# Patient Record
Sex: Male | Born: 1935
Health system: Southern US, Community
[De-identification: ages and names within clinical notes are randomized; demographics above are authoritative.]

## PROBLEM LIST (undated history)

## (undated) DIAGNOSIS — I10 Essential (primary) hypertension: Secondary | ICD-10-CM

## (undated) DIAGNOSIS — D66 Hereditary factor VIII deficiency: Secondary | ICD-10-CM

## (undated) HISTORY — PX: OTHER SURGICAL HISTORY: SHX169

---

## 1999-10-19 ENCOUNTER — Inpatient Hospital Stay (HOSPITAL_COMMUNITY): Admission: EM | Admit: 1999-10-19 | Discharge: 1999-10-21 | Payer: Self-pay | Admitting: Emergency Medicine

## 1999-10-19 ENCOUNTER — Encounter: Payer: Self-pay | Admitting: Internal Medicine

## 1999-12-11 ENCOUNTER — Encounter: Payer: Self-pay | Admitting: Emergency Medicine

## 1999-12-12 ENCOUNTER — Observation Stay (HOSPITAL_COMMUNITY): Admission: EM | Admit: 1999-12-12 | Discharge: 1999-12-13 | Payer: Self-pay | Admitting: Emergency Medicine

## 1999-12-12 ENCOUNTER — Encounter: Payer: Self-pay | Admitting: Emergency Medicine

## 2001-04-14 ENCOUNTER — Emergency Department (HOSPITAL_COMMUNITY): Admission: EM | Admit: 2001-04-14 | Discharge: 2001-04-15 | Payer: Self-pay | Admitting: Emergency Medicine

## 2001-04-14 ENCOUNTER — Encounter: Payer: Self-pay | Admitting: Emergency Medicine

## 2001-04-15 ENCOUNTER — Inpatient Hospital Stay (HOSPITAL_COMMUNITY): Admission: EM | Admit: 2001-04-15 | Discharge: 2001-04-21 | Payer: Self-pay | Admitting: Oncology

## 2001-04-15 ENCOUNTER — Encounter: Payer: Self-pay | Admitting: Oncology

## 2001-05-16 ENCOUNTER — Encounter (HOSPITAL_COMMUNITY): Admission: RE | Admit: 2001-05-16 | Discharge: 2001-08-14 | Payer: Self-pay | Admitting: Oncology

## 2001-07-13 ENCOUNTER — Encounter: Payer: Self-pay | Admitting: Emergency Medicine

## 2001-07-13 ENCOUNTER — Emergency Department (HOSPITAL_COMMUNITY): Admission: EM | Admit: 2001-07-13 | Discharge: 2001-07-13 | Payer: Self-pay | Admitting: Emergency Medicine

## 2001-09-12 ENCOUNTER — Encounter: Payer: Self-pay | Admitting: Oncology

## 2001-09-12 ENCOUNTER — Inpatient Hospital Stay (HOSPITAL_COMMUNITY): Admission: EM | Admit: 2001-09-12 | Discharge: 2001-09-16 | Payer: Self-pay | Admitting: Oncology

## 2001-09-26 ENCOUNTER — Encounter (HOSPITAL_COMMUNITY): Admission: RE | Admit: 2001-09-26 | Discharge: 2001-09-28 | Payer: Self-pay | Admitting: Oncology

## 2002-01-11 ENCOUNTER — Encounter (HOSPITAL_COMMUNITY): Admission: RE | Admit: 2002-01-11 | Discharge: 2002-04-11 | Payer: Self-pay | Admitting: Oncology

## 2002-11-17 ENCOUNTER — Encounter: Payer: Self-pay | Admitting: Emergency Medicine

## 2002-11-18 ENCOUNTER — Inpatient Hospital Stay (HOSPITAL_COMMUNITY): Admission: EM | Admit: 2002-11-18 | Discharge: 2002-11-22 | Payer: Self-pay | Admitting: Emergency Medicine

## 2002-11-18 ENCOUNTER — Encounter: Payer: Self-pay | Admitting: Emergency Medicine

## 2002-11-18 ENCOUNTER — Encounter: Payer: Self-pay | Admitting: Internal Medicine

## 2002-12-26 ENCOUNTER — Encounter: Payer: Self-pay | Admitting: Orthopedic Surgery

## 2002-12-26 ENCOUNTER — Inpatient Hospital Stay (HOSPITAL_COMMUNITY): Admission: RE | Admit: 2002-12-26 | Discharge: 2003-01-04 | Payer: Self-pay | Admitting: Orthopedic Surgery

## 2003-01-01 ENCOUNTER — Encounter: Payer: Self-pay | Admitting: Oncology

## 2003-01-21 ENCOUNTER — Emergency Department (HOSPITAL_COMMUNITY): Admission: EM | Admit: 2003-01-21 | Discharge: 2003-01-21 | Payer: Self-pay | Admitting: Emergency Medicine

## 2003-01-26 ENCOUNTER — Encounter (HOSPITAL_COMMUNITY): Admission: RE | Admit: 2003-01-26 | Discharge: 2003-01-26 | Payer: Self-pay | Admitting: Oncology

## 2003-01-26 ENCOUNTER — Emergency Department (HOSPITAL_COMMUNITY): Admission: EM | Admit: 2003-01-26 | Discharge: 2003-01-26 | Payer: Self-pay | Admitting: Emergency Medicine

## 2003-04-02 ENCOUNTER — Encounter (HOSPITAL_COMMUNITY): Admission: RE | Admit: 2003-04-02 | Discharge: 2003-04-11 | Payer: Self-pay | Admitting: Oncology

## 2003-04-02 ENCOUNTER — Encounter: Payer: Self-pay | Admitting: Oncology

## 2003-04-13 ENCOUNTER — Inpatient Hospital Stay (HOSPITAL_COMMUNITY): Admission: EM | Admit: 2003-04-13 | Discharge: 2003-04-25 | Payer: Self-pay | Admitting: Oncology

## 2003-04-14 ENCOUNTER — Encounter (HOSPITAL_COMMUNITY): Payer: Self-pay | Admitting: Oncology

## 2003-04-18 ENCOUNTER — Encounter (INDEPENDENT_AMBULATORY_CARE_PROVIDER_SITE_OTHER): Payer: Self-pay | Admitting: Cardiology

## 2003-04-19 ENCOUNTER — Encounter: Payer: Self-pay | Admitting: Oncology

## 2003-11-10 ENCOUNTER — Emergency Department (HOSPITAL_COMMUNITY): Admission: EM | Admit: 2003-11-10 | Discharge: 2003-11-10 | Payer: Self-pay | Admitting: Emergency Medicine

## 2003-11-11 ENCOUNTER — Inpatient Hospital Stay (HOSPITAL_COMMUNITY): Admission: EM | Admit: 2003-11-11 | Discharge: 2003-11-16 | Payer: Self-pay | Admitting: Emergency Medicine

## 2003-12-09 ENCOUNTER — Inpatient Hospital Stay (HOSPITAL_COMMUNITY): Admission: EM | Admit: 2003-12-09 | Discharge: 2003-12-18 | Payer: Self-pay

## 2004-06-23 ENCOUNTER — Inpatient Hospital Stay (HOSPITAL_COMMUNITY): Admission: EM | Admit: 2004-06-23 | Discharge: 2004-06-28 | Payer: Self-pay | Admitting: Emergency Medicine

## 2004-07-09 ENCOUNTER — Encounter: Admission: RE | Admit: 2004-07-09 | Discharge: 2004-08-06 | Payer: Self-pay | Admitting: Internal Medicine

## 2004-12-15 ENCOUNTER — Ambulatory Visit (HOSPITAL_COMMUNITY): Admission: RE | Admit: 2004-12-15 | Discharge: 2004-12-15 | Payer: Self-pay | Admitting: Oncology

## 2005-01-29 ENCOUNTER — Ambulatory Visit: Payer: Self-pay | Admitting: Oncology

## 2005-10-23 ENCOUNTER — Ambulatory Visit: Payer: Self-pay | Admitting: Oncology

## 2006-02-01 ENCOUNTER — Ambulatory Visit: Payer: Self-pay | Admitting: Oncology

## 2006-04-08 ENCOUNTER — Ambulatory Visit: Payer: Self-pay | Admitting: Oncology

## 2006-08-03 ENCOUNTER — Ambulatory Visit: Payer: Self-pay | Admitting: Oncology

## 2009-01-03 ENCOUNTER — Ambulatory Visit: Payer: Self-pay | Admitting: Oncology

## 2010-01-06 ENCOUNTER — Ambulatory Visit: Payer: Self-pay | Admitting: Oncology

## 2011-01-08 ENCOUNTER — Encounter (HOSPITAL_BASED_OUTPATIENT_CLINIC_OR_DEPARTMENT_OTHER): Payer: Medicare Other | Admitting: Oncology

## 2011-01-08 DIAGNOSIS — M2500 Hemarthrosis, unspecified joint: Secondary | ICD-10-CM

## 2011-01-08 DIAGNOSIS — D66 Hereditary factor VIII deficiency: Secondary | ICD-10-CM

## 2011-01-08 DIAGNOSIS — G894 Chronic pain syndrome: Secondary | ICD-10-CM

## 2011-01-16 NOTE — Op Note (Signed)
NAME:  [name], [name]                       ACCOUNT NO.:  [id number]   MEDICAL RECORD NO.:  [id number]                   PATIENT TYPE:  INP   LOCATION:  0356                                 FACILITY:  [facility]   PHYSICIAN:  Excell Seltzer. Annabell Howells, M.D.                 DATE OF BIRTH:  11/17/1930   DATE OF PROCEDURE:  11/13/2003  DATE OF DISCHARGE:                                 OPERATIVE REPORT   PROCEDURE:  1. Cystoscopy.  2. Bilateral retrograde pyelograms with interpretation.   PREOPERATIVE DIAGNOSIS:  Hematuria with a history of right hydronephrosis.   POSTOPERATIVE DIAGNOSIS:  Hematuria with a history of right hydronephrosis.   SURGEON:  Dr. Bjorn Pippin.   ANESTHESIA:  General.   COMPLICATIONS:  None.   INDICATIONS:  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  CT scan revealed  right hydronephrosis with a questionable filling defect in the collecting  system with dilation of the ureter to the UVJ.  It was felt that cystoscopy,  retrogrades, and possibly ureteroscopy was indicated.   FINDINGS AND PROCEDURE:  The patient was given IV Cipro.  He was taken to  the operating room where a general anesthetic was induced.  He was placed in  lithotomy position.  This was somewhat restricted by his orthopedic issues,  but he was able to be adequately positioned for the study.  His genitalia  was prepped with Betadine solution.  He was draped in the usual sterile  fashion.  A 22 French cystoscope sheath was placed through his slightly  hypospadiac meatus without need for dilation.  Urethra was unremarkable.  The external sphincter was intact.  The prostatic urethra was short without  significant obstruction.  Examination of the bladder revealed moderate to  severe trabeculation without tumor, stones, or inflammation.  The ureteral  orifices were in their normal anatomic position, effluxing clear urine.  The  left ureteral orifice was cannulated with the aid of a guidewire, and  contrast was instilled.  There was some air in the system because of the  need to use the guidewire to get the 5 [place] open-end catheter up, but the  study revealed a normal ureter and internal collecting system.  The right  ureter was cannulated with 5 French open-end catheter.  Contrast was  instilled.  No hydronephrosis or filling defects were identified, and this  study was completely unremarkable.  Of note, on fluoroscopic views, the  patient did have significant angulation of the spine at about L3-L4 level,  possibly due to a compression fracture.  I do not know if  this is chronic or acute finding, but there were no urologic abnormality of  concern.  At this point, the bladder was drained; the patient was taken down  from lithotomy position.  His anesthetic was reversed.  He was moved to the  recovery  room in stable condition.  There were no complications.                                               Excell Seltzer. Annabell Howells, M.D.    JJW/MEDQ  D:  11/13/2003  T:  11/13/2003  Job:  [id number]   cc:   Valentino Hue. Magrinat, M.D.  [address] [facility]  [place]  [place] [address]  Fax: [phone]   Rosanne Sack, M.D.  [address]  [place], [place] [address]  Fax: [phone]

## 2011-01-16 NOTE — H&P (Signed)
NAMEJACKSEN, [name] NO.:  [id number]   MEDICAL RECORD NO.:  [id number]          PATIENT TYPE:  EMS   LOCATION:  ED                           FACILITY:  [facility]   PHYSICIAN:  Hollice Espy, M.D.DATE OF BIRTH:  16-Dec-1930   DATE OF ADMISSION:  06/23/2004  DATE OF DISCHARGE:                                HISTORY & PHYSICAL   CHIEF COMPLAINT:  Hyperglycemia and abdominal pain.   HISTORY OF PRESENT ILLNESS:  Patient is a 77 year old white male past  medical history of Hemophilia A as well as chronic back pain requiring  chronic narcotics.  Patient is normally followed by Dr. Darnelle Catalan for his  hemophilia.  He also manages his pain medications.  Patient is on a number  of narcotics although he ran out a few weeks ago and has since then had to  decrease his narcotic medication so that he can space it out.  He continued  to have more and more severe pain and for the last day has been feeling  worse and worse and then sort of having severe abdominal pain with nausea  and vomiting starting today.  Family became concerned and brought him in to  see Dr. Azucena Cecil, a family practice doctor who had seen the patient a while  back but had not seen him in the recent year or so.  In doing lab workup  patient was noted to have an elevated blood sugar in the 500s which was a  new diagnosis.  In addition he had an elevated white count.  Dr. Azucena Cecil was  concerned that the patient may be having withdrawal from his narcotic  medication and also in addition may be is having newly diagnosed diabetes or  at least need of further evaluation.  He was advised to come into the  emergency room where he was given pain medication, IV fluids, and further  workup was begun.  Patient's lab work he was noted to have a slightly  elevated white count at 11.5, in addition his glucose level was checked on a  serum BMET at 523, patient's lipase level was normal and his UA showed  greater than 1000 of  glucose with a trace hemoglobin, bilirubin, and total  protein.  Patient was given pain medication.  He currently states he is  feeling a little bit better but he is quite sleepy.  He was complaining of  some abdominal pain but is quite vague due to his drowsiness.  He denies any  headaches or visual changes.  He denies any current chest pain,  palpitations, shortness of breath, wheeze, or cough.  He complains of some  abdominal pain which initially he described as diffuse with no focal  tenderness but then on examination and further interrogation he states that  it is more severe in the lower quadrants right and left.  He denies any  current back pain.  He also does not complain of any severe diarrhea or  hematuria.  He, according to his family, has no previous history of steroid  use or history of previously diagnosed diabetes.   PAST MEDICAL  HISTORY:  1.  Arthritis.  2.  Hypertension.  3.  He has a history of Hemophilia A with clotting factor VIII.   MEDICATIONS:  1.  MS Contin 60 mg t.i.d.  2.  MSIR 15 mg p.r.n.  3.  Atenolol 50 mg p.o. b.i.d.  4.  Clonidine 0.1 mg b.i.d.  5.  Norvasc 5 mg p.o. daily.  6.  Milk of magnesia laxative every other day.   ALLERGIES:  Patient is allergic to CODEINE as well as there is a report here  on his medical information sheet that because of his hemophilia he is not to  receive IV MORPHINE.   HABITS:  Patient denies any tobacco, alcohol, or drug use.   FAMILY HISTORY:  Noncontributory.   PHYSICAL EXAMINATION:  VITALS:  The patient's vitals on admission  temperature 97.2, blood pressure is 186/83, since then it has come down to  172/77, respirations 20, heart rate is initially 72, O2 saturation 98% on  room air.  GENERAL:  He appears to be quite drowsy but when awakened he can remain  awake enough to be oriented x3.  HEENT:  Normocephalic, atraumatic.  His mucous membranes are dry.  He has no  carotid bruits.  HEART:  Regular rate and  rhythm, S1, S2.  LUNGS:  Clear to auscultation bilaterally.  ABDOMEN:  Soft, slightly distended, it is tender mostly in the right and  left lower quadrants with deep palpation.  EXTREMITIES:  No clubbing, cyanosis, or edema.  He has moderately 1+ pulses  bilaterally.   LABORATORY WORK:  White count of 11.5 with a 94% shift, H&H 14.4 and 45.1,  MCV of 93, platelet count of 165.  Sodium 132, potassium 4.6, chloride 99,  bicarb 19, BUN 12, creatinine 1.3, glucose 523.  LFTs:  Total protein of  8.2, albumin 4.2, AST is elevated at 207, ALT is 188, alk phos is 103, total  bili is 2.6, lipase is 21, PT and INR is 13.3 and 1.  UA shows greater than  1000 glucose, trace hemoglobin, 40 of ketone, total protein of 100 A1C is  drawn and pending, white cells in the urine is 0-2.  Patient is having a CT  of the abdomen and pelvis ordered, however, he has not been able to keep  down his contrast.   ASSESSMENT AND PLAN:  1.  Hyperglycemia.  Patient has a CBG in the high 500s.  No previous history      of diabetes mellitus or being on steroids, maybe an etiology of his      abdominal pain, patient received insulin in the emergency room, follow      q.6h. CBGs and insulin coverage, check an A1C.  Other etiologies given      his slightly elevated transaminases and abdominal pain this may be some      sort of obstruction of both his pancreas and his liver.  2.  Abdominal pain with nausea and vomiting.  He has some increased      tenderness in the lower quadrants though this may be a withdrawal from      narcotics versus diverticulitis versus constipation versus as above      maybe some sort of form of pancreatitis.  We will go ahead and check a      CT with contrast.  If need be we will need to put an nasogastric tube      down.  In addition we will check a lipase level now.  3.  History of Hemophilia A - has received Factor VIII in the past and     chemo.  He is stabled, is followed by Dr. Darnelle Catalan,  this appears to be a      stable issue for now, we will not consult unless need be.  4.  History of chronic back pain and arthritis.  He is on intravenous      Dilaudid.  He is not to receive intravenous      morphine but once he is able to take p.o. we will restart his MS Contin.  5.  Hypertension.  All his p.o. medications are held until he is cleared by      CT.  Intravenous as needed hypertensive medications.     Send   SKK/MEDQ  D:  06/23/2004  T:  06/23/2004  Job:  [id number]   cc:   Valentino Hue. Magrinat, M.D.  [address] [facility]  [place]  [place] [address]  Fax: [phone]   Tally Joe, M.D.  [address] [place] [address]  [place], [place] [address]  Fax: [phone]

## 2011-01-16 NOTE — Discharge Summary (Signed)
[name], [name] NO.:  [id number]   MEDICAL RECORD NO.:  [id number]          PATIENT TYPE:  INP   LOCATION:  0346                         FACILITY:  [facility]   PHYSICIAN:  Deirdre Peer. Polite, M.D. DATE OF BIRTH:  07-02-1931   DATE OF ADMISSION:  06/23/2004  DATE OF DISCHARGE:  06/28/2004                                 DISCHARGE SUMMARY   DISCHARGE DIAGNOSES:  1.  Newly diagnosed diabetes with hemoglobin A1c of 10.7.  2.  Constipation secondary to narcotics plus or minus dehydration, plus or      minus a component of gastroparesis secondary to diabetes.  3.  Hemophilia A.  4.  Hypertension.  5.  Arthritis.  6.  Chronic pain.  7.  Dysuria, we will treat empirically for urinary tract infection, we will      obtain culture prior to discharge as symptoms were first mentioned day      of discharge.  8.  Constipation.  9.  Rule out gastroparesis.  10. Hepatitis C.   DISCHARGE MEDICATIONS:  1.  MS Contin 60 mg 1 every 8 hours.  2.  Protonix 40 mg daily.  3.  Lantus 26 units daily.  4.  Glyburide 5 mg every 12 hours.  5.  Atenolol 50 mg 1 every 12 hours.  6.  Cipro 250 mg every 12 hours x3 days.  7.  MiraLax 17 grams in 8 ounces of water daily.  8.  Reglan 10 mg every 6 hours.  9.  MSIR every 3-4 hours as needed p.r.n.  10. Clonidine home dose.   DISPOSITION:  The patient is being discharged to home in stable condition,  asked to follow up with primary MD in 3-4 days.  The patient is asked to  check blood sugars twice a day, keep a log and take the numbers to his  primary MD for further titration of diabetes medications.   The patient is asked to take all medications as instructed, not to try to  withdraw himself from his narcotic medications.   CONSULTANTS:  None.   STUDIES:  UA:  Specific gravity 1.034, greater than 1000 glucose, LE and  nitrite negative, no bacteria, hemoglobin A1c 10.7.  BMET within normal  limits except for elevated CBG.  AST and  ALT 207 and 108, followup labs 153  and 160.  Hepatitis C antibody positive, please note this is an old  diagnosis, hepatitis C.  CT of the abdomen and pelvis significant for  diverticulosis, no itis, right inguinal hernia, no bowel obstruction.  Please note there is a small right inguinal hernia containing only fat, no  evidence of herniated bowel or bowel obstruction.  Mild sigmoid  diverticulosis, no evidence of diverticulitis.  Chest x-ray:  No change in  elevation of right hemidiaphragm, no active lung disease.  Urine culture:  35,000 colonies, multiple species.  Abdominal series unremarkable good bowel  gas pattern does show stool within the colon.   HISTORY OF PRESENT ILLNESS:  A 77 year old male with multiple medical  problems who presents to the hospital for evaluation after presenting to his  primary  M.D. office for evaluation.  In the primary MD's office the patient  was found to have hyperglycemia.  The patient did not have a diagnosis of  diabetes at that time, therefore, the patient was sent to the hospital for  further evaluation and treatment as he had also complaints of abdominal  pain.  Admission was deemed necessary for further evaluation and treatment.  Please see dictated H&P for further details.   PAST MEDICAL HISTORY:  As stated above.   ADMISSION MEDICATIONS:  Per admission H&P.   SOCIAL HISTORY:  No tobacco, no alcohol, no drugs.   HOSPITAL COURSE:  1.  The patient was admitted to medicine floor bed for evaluation and      treatment of newly diagnosed diabetes.  The patient had screening labs      which included a hemoglobin A1c which revealed A1c of 10.7.  Because of      the patients complaints of abdominal pain and constipation +/-      gastroparesis, p.o. intake was limited, therefore in the interim the      patient was contained on sliding-scale insulin and Lantus.  The      patient's medications were slowly increased adding in oral agent, as      well  as increasing the Lantus.  The patient was seen by a diabetic      educator and was given education on diabetes, the patient has been made      aware of an ADA diet.  The patient has been informed that his      medications will most likely need to be increased and this will be done      by his primary MD as his diet necessitates.  The patient was given      prescription for all meds and for Glucometer.  The patient was      instructed to check blood sugars twice a day and keep a log, and provide      this information to his primary MD for further titration of his      medicine.  The patient will be seen by home health RN for further      diabetic educations.  2.  Abdominal pain.  Differential diagnosis for the patients abdominal pain      is multifactorial.  The patient had a CT and abdominal series, there is      no pathology noted on the CT or the abdominal series, the patient did      have increased stool in the colon.  As the patient was newly diagnosed      with diabetes, it was also suspected the patient may have developed      gastroparesis +/- dehydration as a result of his uncontrolled diabetes.      The patient was continued on IV fluids, adequate treatment for his      diabetes, and IV Reglan was added as well as laxative.  After having      several bowel movements the patient had significant improvement in his      complaints of abdominal discomfort.  At this time it was felt that no      further evaluation is needed, still felt that it is multifactorial for      his complaints of abdominal discomfort.  As stated, CT was negative for      pathology.  The patient has been somewhat irregular in his laxative use,      discussed this  with the patient, recommended MiraLax daily.  Because of      diabetes I feel that some amount of gastroparesis may be an issue,      therefore, Reglan has been added.  It is recommended that the patient     have further outpatient followup with his  primary MD and determine if      indeed Reglan is needed as part of the patients medical regimen.  In the      interim, definitely recommend the patient continue daily laxatives as he      is on chronic narcotics.  3.  Dysuria.  On the day of discharge the patient complained of some      dysuria.  The patient is afebrile.  The patient will be discharged to      home with Cipro 250 mg b.i.d. x3 days, we will try to obtain a      urinalysis prior to discharge.  The patient can have further followup      for this with his primary M.D. on an outpatient basis.  The patient has      several other chronic medical problems which include hemophilia A and      hypertension, and had not had any problems during this hospitalization.      The patient will continue with his current outpatient meds.  The patient      also has chronic pain.  The patients medical regimen has been reviewed      with him as well.  It was recommended that the patient continue meds as      outlined.  Seeing that the patient has some concerns about his      medications and has tried to wean himself off of his chronic narcotics,      the patient has been counseled against this at this time and is      instructed to take all medications as outlined by M.D. and only should      have titration of his narcotics by a physician.  At this time the      patient is medically stable for discharge.     Joen Laura   RDP/MEDQ  D:  06/28/2004  T:  06/28/2004  Job:  [id number]   cc:   Tally Joe, M.D.  [address] [place] [address]  [place], [place] [address]  Fax: [phone]   Valentino Hue. Magrinat, M.D.  [address] [facility]  [place]  [place] [address]  Fax: [phone]

## 2011-01-16 NOTE — Consult Note (Signed)
NAME:  ANUJ, SUMMONS                       ACCOUNT NO.:  [id number]   MEDICAL RECORD NO.:  [id number]                   PATIENT TYPE:  INP   LOCATION:  0270                                 FACILITY[facility]  [facility]   PHYSICIAN:  Excell Seltzer. Annabell Howells, M.D.                 DATE OF BIRTH:  07-02-1931   DATE OF CONSULTATION:  12/11/2003  DATE OF DISCHARGE:                                   CONSULTATION   REFERRING PHYSICIAN:  Leighton Roach. Truett Perna, M.D.   CHIEF COMPLAINT:  Left groin pain.   HISTORY:  This is a 77 year old white male with hemophilia A admitted for  right chest wall hematoma.  He is complaining of some discomfort in the left  groin area.  I did a thorough GU workup of this individual in March, 2005  for hematuria and right hydronephrosis at the time of his last bleed.  That  workup was negative.  He does complain of some dysuria, and on admission UA,  appears to have a urinary tract infection.  I find no culture results.  The  patient has been placed on Cipro.  He had a CT scan of the chest which  revealed no abnormalities of the upper abdomen.   PAST MEDICAL HISTORY:  Significant for hemophilia A, recurrent hemarthrosis,  chronic pain syndrome, hypertension, history of coronary artery disease,  chronic renal insufficiency, prior knee replacements, hepatitis C positive,  and chronic constipation.   ALLERGIES:  1. NONSTEROIDALS ANTI-INFLAMMATORIES.  2. ASPIRIN.   CURRENT MEDICATIONS:  Norvasc, Protonix, morphine, Tenormin, Catapres,  Cardura, Tylenol, Benadryl, MiraLax, lisinopril, Cipro, factor VIII.   FAMILY/SOCIAL HISTORY:  Unchanged since my last consultation on November 13, 2003.   REVIEW OF SYSTEMS:  He complains of some chest wall pain and ecchymosis.  He  has had a fever felt to be likely secondary to his hematoma.  He has the  dysuria, as noted above, and the left groin pain.   PHYSICAL EXAMINATION:  VITAL SIGNS:  Temp 99.2, heart rate 66, blood  pressure 114/52.  GENERAL:  He is a well-developed and well-nourished white male in no acute  distress.  BACK:  He has right flank ecchymosis.  ABDOMEN:  Soft and obese without hernia.  GU:  An unremarkable phallus with an adequate meatus.  Scrotum is  unremarkable.  Testicles are bilaterally descended and normal in size and  consistency without mass or tenderness.  The epididymis are unremarkable.  I  feel no palpable abnormalities of the left groin, certainly no hernias or  adenopathy.   IMPRESSION:  1. Left groin pain that is non-urologic.  2. Urinary tract infection, currently on Cipro.  3. History of hematuria.  Now without significant bleeding in the urine.  4. Right flank ecchymosis.   PLAN:  I have no additional recommendations.  I would just suggest you treat  his groin discomfort symptomatically but no further urologic workup is  required at this time.                                               Excell Seltzer. Annabell Howells, M.D.    JJW/MEDQ  D:  12/11/2003  T:  12/11/2003  Job:  [id number]

## 2011-07-13 ENCOUNTER — Other Ambulatory Visit: Payer: Self-pay | Admitting: *Deleted

## 2011-07-13 MED ORDER — MORPHINE SULFATE 30 MG PO TABS
30.0000 mg | ORAL_TABLET | ORAL | Status: AC | PRN
Start: 2011-07-13 — End: 2011-07-23

## 2011-09-02 ENCOUNTER — Other Ambulatory Visit: Payer: Self-pay | Admitting: *Deleted

## 2011-09-02 DIAGNOSIS — M2550 Pain in unspecified joint: Secondary | ICD-10-CM

## 2011-09-02 MED ORDER — MORPHINE SULFATE 30 MG PO TABS: 30.0000 mg | ORAL_TABLET | ORAL | Status: AC | PRN

## 2011-09-08 ENCOUNTER — Other Ambulatory Visit: Payer: Self-pay | Admitting: *Deleted

## 2011-09-08 MED ORDER — MORPHINE SULFATE 30 MG PO TABS: 30.0000 mg | ORAL_TABLET | ORAL | Status: AC | PRN

## 2011-10-21 ENCOUNTER — Other Ambulatory Visit: Payer: Self-pay | Admitting: *Deleted

## 2011-10-21 MED ORDER — MORPHINE SULFATE 30 MG PO TABS
30.0000 mg | ORAL_TABLET | ORAL | Status: AC | PRN
Start: 2011-10-21 — End: 2011-10-31

## 2011-12-15 ENCOUNTER — Other Ambulatory Visit: Payer: Self-pay | Admitting: *Deleted

## 2011-12-15 MED ORDER — MORPHINE SULFATE 30 MG PO TABS: 30.0000 mg | ORAL_TABLET | ORAL | Status: AC | PRN

## 2012-01-01 ENCOUNTER — Telehealth: Payer: Self-pay | Admitting: Oncology

## 2012-01-01 NOTE — Telephone Encounter (Signed)
S/w pt re appt for 6/3 °

## 2012-01-13 ENCOUNTER — Telehealth: Payer: Self-pay | Admitting: Oncology

## 2012-01-13 NOTE — Telephone Encounter (Signed)
S/w the pt and he is aware of his June appts with dr Darnelle Catalan

## 2012-02-01 ENCOUNTER — Other Ambulatory Visit (HOSPITAL_BASED_OUTPATIENT_CLINIC_OR_DEPARTMENT_OTHER): Payer: Medicare Other | Admitting: Lab

## 2012-02-01 ENCOUNTER — Other Ambulatory Visit: Payer: Self-pay | Admitting: *Deleted

## 2012-02-01 ENCOUNTER — Ambulatory Visit (HOSPITAL_BASED_OUTPATIENT_CLINIC_OR_DEPARTMENT_OTHER): Payer: Medicare Other | Admitting: Oncology

## 2012-02-01 VITALS — BP 180/68 | HR 46 | Temp 98.0°F | Wt 162.4 lb

## 2012-02-01 DIAGNOSIS — M2500 Hemarthrosis, unspecified joint: Secondary | ICD-10-CM

## 2012-02-01 DIAGNOSIS — R52 Pain, unspecified: Secondary | ICD-10-CM

## 2012-02-01 DIAGNOSIS — D66 Hereditary factor VIII deficiency: Secondary | ICD-10-CM

## 2012-02-01 DIAGNOSIS — G894 Chronic pain syndrome: Secondary | ICD-10-CM

## 2012-02-01 DIAGNOSIS — I998 Other disorder of circulatory system: Secondary | ICD-10-CM

## 2012-02-01 LAB — CBC WITH DIFFERENTIAL/PLATELET
BASO%: 0.3 % (ref 0.0–2.0)
Basophils Absolute: 0 10*3/uL (ref 0.0–0.1)
EOS%: 1.9 % (ref 0.0–7.0)
Eosinophils Absolute: 0.1 10*3/uL (ref 0.0–0.5)
HCT: 39.3 % (ref 38.4–49.9)
HGB: 13.7 g/dL (ref 13.0–17.1)
LYMPH%: 33.4 % (ref 14.0–49.0)
MCH: 30 pg (ref 27.2–33.4)
MCHC: 34.9 g/dL (ref 32.0–36.0)
MCV: 86.2 fL (ref 79.3–98.0)
MONO#: 0.7 10*3/uL (ref 0.1–0.9)
MONO%: 10.8 % (ref 0.0–14.0)
NEUT#: 3.4 10*3/uL (ref 1.5–6.5)
NEUT%: 53.6 % (ref 39.0–75.0)
Platelets: 100 10*3/uL — ABNORMAL LOW (ref 140–400)
RBC: 4.56 10*6/uL (ref 4.20–5.82)
RDW: 14.3 % (ref 11.0–14.6)
WBC: 6.3 10*3/uL (ref 4.0–10.3)
lymph#: 2.1 10*3/uL (ref 0.9–3.3)

## 2012-02-01 MED ORDER — MORPHINE SULFATE 30 MG PO TABS: 30.0000 mg | ORAL_TABLET | ORAL | Status: DC | PRN

## 2012-02-01 NOTE — Progress Notes (Signed)
ID: [name]   DOB: 11-14-1930  MR#: [id number]  QMV#:[id number]  INTERVAL HISTORY: [name] returns for followup of his hemophilia. He has been remarkably stable, without any overt bleeding. His biggest problem right now is his dog, and animal he lives a great deal, which has some kind of cancer "bursting all over her". It does sound to me like as she will need to be put down this week.  REVIEW OF SYSTEMS: He has a history of palpitations, remotely evaluated by cardiology, and occasionally he tells me he notices that when he does some sudden activity. They don't occur at rest. He describes himself as  Fatigued. He has problems with his gums, and of course he bruises easily. He has multiple joints that hurt all the time, particularly his right elbow. Of course his back hurts as well. He is managing to have regular bowel movements despite the narcotics. He describes himself as forgetful, but not depressed. Overall aside from the problems with pain and difficulty ambulating secondary to his hemarthroses, his review of systems was stable.  PAST MEDICAL HISTORY: Significant for hemophilia A., moderate; multiple hemarthroses, with chronic pain syndrome; history of cataracts, GERD, borderline glucose intolerance, and possible remote NSTEMI  FAMILY HISTORY His father died at the age of 76 with prostate cancer. His mother died at the age of 43. He has 3 brothers and one sister. There is no history of hemophilia or other cancer in the family.  SOCIAL HISTORY: He used to work in a loading dock. His wife of 54 years, Fleet Contras, used to work in [place]. The patient has 5 children, one in [place] one in [place] one in [place] one in [place] and one in [place]. He has more than 20 grandchildren. He attends a [employer].  ADVANCED DIRECTIVES:  HEALTH MAINTENANCE: History  Substance Use Topics  . Smoking status: Not on file  . Smokeless tobacco: Not on file  . Alcohol Use: Not on file      Colonoscopy:  PAP:  Bone density:  Lipid panel:  Allergies  Allergen Reactions  . Aspirin Other (See Comments)    Increased bleeding per hemchromatosis  . Ibuprofen Other (See Comments)    Bleeding per hemechromatosis  . Prednisone Other (See Comments)    Bleeding per hemechromatosis    Current Outpatient Prescriptions  Medication Sig Dispense Refill  . morphine (MSIR) 30 MG tablet       . polyethylene glycol (MIRALAX / GLYCOLAX) packet Take 17 g by mouth daily as needed.        OBJECTIVE:  Elderly white male who appears moderately uncomfortable Filed Vitals:   02/01/12 1427  BP: 180/68  Pulse: 46  Temp: 98 F (36.7 C)     There is no height on file to calculate BMI.    ECOG FS: 2  Sclerae unicteric Oropharynx clear No peripheral adenopathy Lungs no rales or rhonchi Heart regular rate and rhythm, no murmur appreciate Abd benign, no splenomegaly noted MSK  Multiple joint deformities, as previously noted; the right elbow, where he is having more pain, is minimally swollen, but not erythematous. There is significantly decreased range of motion there. Neuro: nonfocal  LAB RESULTS: Lab Results  Component Value Date   WBC 6.3 02/01/2012   NEUTROABS 3.4 02/01/2012   HGB 13.7 02/01/2012   HCT 39.3 02/01/2012   MCV 86.2 02/01/2012   PLT 100 Large platelets present* 02/01/2012      Chemistry   No results found for this  basename: NA, K, CL, CO2, BUN, CREATININE, GLU   No results found for this basename: CALCIUM, ALKPHOS, AST, ALT, BILITOT       No results found for this basename: LABCA2    No components found with this basename: LABCA125    No results found for this basename: INR:1;PROTIME:1 in the last 168 hours  Urinalysis No results found for this basename: colorurine, appearanceur, labspec, phurine, glucoseu, hgbur, bilirubinur, ketonesur, proteinur, urobilinogen, nitrite, leukocytesur    STUDIES: No new results found.  ASSESSMENT: 78 year old [place]  man with a history of hemophilia A, pain syndrome secondary to multiple prior fractures and hemarthroses.   PLAN: From my point of view [name] is very stable. He has been on the same dose of morphine for quite some time now and we're making no changes today. He knows that a manage the constipation associated with a chronic narcotic use. There has been no interval bleeding and he is well aware of on how to avoid medications and activities that might cause him to bleed. Otherwise she will return to see me in one year. He knows we will be glad to see you more frequently on an as-needed basis.    Roshawnda Pecora C    02/01/2012

## 2012-02-02 ENCOUNTER — Telehealth: Payer: Self-pay | Admitting: Oncology

## 2012-02-02 NOTE — Telephone Encounter (Signed)
S/w the pt and he is aware that i will be mailing him his June 2014 appt calendar

## 2012-03-28 ENCOUNTER — Other Ambulatory Visit: Payer: Self-pay | Admitting: *Deleted

## 2012-03-28 DIAGNOSIS — D66 Hereditary factor VIII deficiency: Secondary | ICD-10-CM

## 2012-03-28 MED ORDER — MORPHINE SULFATE 30 MG PO TABS: 30.0000 mg | ORAL_TABLET | ORAL | Status: DC | PRN

## 2012-05-11 ENCOUNTER — Other Ambulatory Visit: Payer: Self-pay | Admitting: *Deleted

## 2012-05-11 DIAGNOSIS — D66 Hereditary factor VIII deficiency: Secondary | ICD-10-CM

## 2012-05-11 MED ORDER — MORPHINE SULFATE 30 MG PO TABS: 30.0000 mg | ORAL_TABLET | ORAL | Status: DC | PRN

## 2012-05-13 ENCOUNTER — Other Ambulatory Visit: Payer: Self-pay | Admitting: Emergency Medicine

## 2012-06-27 ENCOUNTER — Other Ambulatory Visit: Payer: Self-pay | Admitting: *Deleted

## 2012-06-27 DIAGNOSIS — D66 Hereditary factor VIII deficiency: Secondary | ICD-10-CM

## 2012-06-27 MED ORDER — MORPHINE SULFATE 30 MG PO TABS: 30.0000 mg | ORAL_TABLET | ORAL | Status: DC | PRN

## 2012-08-05 ENCOUNTER — Other Ambulatory Visit: Payer: Self-pay | Admitting: Emergency Medicine

## 2012-08-05 DIAGNOSIS — D66 Hereditary factor VIII deficiency: Secondary | ICD-10-CM

## 2012-08-05 MED ORDER — MORPHINE SULFATE 30 MG PO TABS: 30.0000 mg | ORAL_TABLET | ORAL | Status: DC | PRN

## 2012-09-27 ENCOUNTER — Other Ambulatory Visit: Payer: Self-pay | Admitting: *Deleted

## 2012-09-27 DIAGNOSIS — D66 Hereditary factor VIII deficiency: Secondary | ICD-10-CM

## 2012-09-27 MED ORDER — MORPHINE SULFATE 30 MG PO TABS: 30.0000 mg | ORAL_TABLET | ORAL | Status: DC | PRN

## 2012-11-22 ENCOUNTER — Other Ambulatory Visit: Payer: Self-pay | Admitting: *Deleted

## 2012-11-22 DIAGNOSIS — D66 Hereditary factor VIII deficiency: Secondary | ICD-10-CM

## 2012-11-22 MED ORDER — MORPHINE SULFATE 30 MG PO TABS: 30.0000 mg | ORAL_TABLET | ORAL | Status: DC | PRN

## 2012-11-22 NOTE — Telephone Encounter (Signed)
Patient calling in for refill on MSIR, but states he had a sudden increase in copay, from $7.00-$40.00. He states he asked the pharmacy why and they said he may have changed "tiers" of this drug. After explaining several scenerios and situations to this patient of possibilities, it was clear he does not understand. Informed patient to ask to see our Care management team when he comes in to pick up Rx, maybe they can look at his insurance for a reason of the increase.

## 2012-12-22 ENCOUNTER — Encounter: Payer: Self-pay | Admitting: Cardiology

## 2012-12-22 DIAGNOSIS — E785 Hyperlipidemia, unspecified: Secondary | ICD-10-CM

## 2012-12-22 DIAGNOSIS — I209 Angina pectoris, unspecified: Secondary | ICD-10-CM | POA: Insufficient documentation

## 2012-12-22 NOTE — Progress Notes (Unsigned)
Patient ID: [name], male   DOB: 10-Nov-1930, 79 y.o.   MRN: [id number]  [name], [name]  Date of visit:  12/22/2012 DOB:  September 10, 1930    Age:  79 yrs. Medical record number:  [id number]     Account number:  [id number] Primary Care Provider: Gaspar Garbe ____________________________ CURRENT DIAGNOSES  1. Chest Pain  2. Hypertensive Heart Disease-Benign without CHF  3. Angina Pectoris  4. CAD,Native  5. Hyperlipidemia  6. Other And Unspecified Coagulation Defects  7. Diabetes Mellitus-NIDD ____________________________ ALLERGIES  aspirin, Bleeding  codeine sulfate ____________________________ MEDICATIONS  1. morphine 30 mg tablet, PRN  2. atenolol 50 mg tablet, BID  3. nitroglycerin 0.4 mg tablet, sublingual, PRN  4. atorvastatin 20 mg tablet, 1 p.o. daily ____________________________ CHIEF COMPLAINTS  Chest pain + dyspnea with exertion  Relief with rest ____________________________ HISTORY OF PRESENT ILLNESS  Patient seen for cardiac consultation. The patient has a long-standing history of hemophilia with significant complications of joint problems. He has had numerous joint bleeds through the years. I saw him around 10 years ago with angina and he had a low risk nuclear stress test at that time. Since that time he has been taken off of all of his antianginal medications as well as his medicines for lipid lowering. He comes in today noting that he began to have recurrent angina a year or so ago. He thinks he has had episodes maybe once a week and usually typically occur with exertion such as walking. 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 He has never had angina at rest and denies PND, orthopnea, syncope, or claudication. He has to walk with crutches. ____________________________ PAST HISTORY  Past Medical Illnesses:  hemophilia, hypertension, obesity, GERD, GI Bleed Hx,  DM-non-insulin dependent;  Cardiovascular Illnesses:  angina (chronic), CAD;  Surgical Procedures:  fx legs, infection r leg removed pin, knee replacement-left;  Cardiology Procedures-Invasive:  no previous interventional or invasive cardiology procedures;  Cardiology Procedures-Noninvasive:  adenosine cardiolite April 2004;  LVEF not documented,   ____________________________ CARDIO-PULMONARY TEST DATES EKG Date:  12/22/2012;  Nuclear Study Date:  12/13/2002;  Echocardiography Date: 04/18/2003;   ____________________________ FAMILY HISTORY Father - age 56, died of prostate cancer; Mother - age 31, died of Alheimer's; Brother 1 - age 23, died of CAD, had CABG; Brother 2 - age 25, died of heart disease; Brother 3 -  deceased and infant; Sister 1 -  alive and well;  ____________________________ SOCIAL HISTORY Alcohol Use:  no alcohol use;  Smoking:  used to smoke but quit Prior to 1980;  Diet:  regular diet;  Lifestyle:  divorced;  Exercise:  no regular exercise;  Occupation:  retired and dock Engineer, petroleum;  Residence:  lives with wife;   ____________________________ REVIEW OF SYSTEMS General:  denies recent weight change, fatique or change in exercise tolerance.  Integumentary:no rashes or new skin lesions. Eyes: wears eye glasses/contact lenses, denies diplopia, glaucoma or visual field defects. Ears, Nose, Throat, Mouth:  denies any hearing loss, epistaxis, hoarseness or difficulty speaking. Respiratory: denies dyspnea, cough, wheezing or hemoptysis. Cardiovascular:  please review HPI Abdominal: constipation Genitourinary-Male: prostatism  Musculoskeletal:  chronic arthritis of the Knees, arthritis of arm Neurological:  denies headaches, stroke, or TIA Hematological/Immunologic:  history of hemophilia ____________________________ PHYSICAL EXAMINATION VITAL SIGNS  Blood Pressure:  134/70 Sitting, Right arm, regular cuff  , 130/76 Standing, Right arm and regular cuff  Pulse:  78/min. Weight:  165.00  lbs. Height:  68"BMI: 25  Constitutional:  pleasant, mildly obese, white, male, walks with crutches Skin:  vitilago Head:  normocephalic, normal hair pattern, no masses or tenderness ENT:  full mouth dentures present Neck:  supple, no masses, thyromegaly, JVD. Carotid pulses are full and equal bilaterally without bruits. Chest:  normal symmetry, clear to auscultation and percussion. Cardiac:  regular rhythm, normal S1 and S2, No S3 or S4, no murmurs, gallops or rubs detected. Abdomen:  abdomen soft,non-tender, no masses, no hepatospenomegaly, or aneurysm noted Peripheral Pulses:  the femoral,dorsalis pedis, and posterior tibial pulses are full and equal bilaterally with no bruits auscultated. Extremities & Back:  normal muscle strength and tone., no edema present, deformity of right leg Neurological:  no gross motor or sensory deficits noted, affect appropriate, oriented x3. ____________________________ MOST RECENT LIPID PANEL 12/22/12  CHOL TOTL 159 mg/dl, LDL 213 calc, HDL 41 mg/dl, TRIGLYCER 73 mg/dl and CHOL/HDL 3.9 (Calc) ____________________________ IMPRESSIONS/PLAN  1. Coronary artery disease with stable angina on no medications 2. Hyperlipidemia 3. History of hypertension 4. Hemophilia  Recommendations:  At the present time he is on no antianginal medicines. He is really not able to take antiplatelet agents because of his hemophilia and thus would be a poor candidate for coronary stenting. His angina is stable and he is really not on any medical therapy. At this time I really don't think a nuclear stress test would be helpful in his management as he is on no medical therapy and coronary bypass grafting would be high risk in him because of his hemophilia and age.  My recommendations would be for him to go back on atenolol 50 mg twice daily and use nitroglycerin as needed for severe episodes. His LDL is slightly elevated and I started him on atorvastatin 20 mg daily for lipid  lowering. I will see him back in followup in 3 weeks and asked him to have an echocardiogram to assess his wall thickness. His EKG today shows some ST depression and T-wave inversions laterally that could be ischemic or could be due to LVH. ____________________________ TODAYS ORDERS  1. 2D, color flow, doppler: First Available  2. Obtain Lab/Xray from PMD  3. Return Visit: 3 weeks  4. 12 Lead EKG: Today                       ____________________________ Cardiology Physician:  Darden Palmer MD [facility]

## 2013-01-02 ENCOUNTER — Other Ambulatory Visit: Payer: Self-pay | Admitting: *Deleted

## 2013-01-02 DIAGNOSIS — D66 Hereditary factor VIII deficiency: Secondary | ICD-10-CM

## 2013-01-02 MED ORDER — MORPHINE SULFATE 30 MG PO TABS: 30.0000 mg | ORAL_TABLET | ORAL | Status: DC | PRN

## 2013-02-01 ENCOUNTER — Other Ambulatory Visit: Payer: Self-pay | Admitting: Physician Assistant

## 2013-02-01 DIAGNOSIS — D66 Hereditary factor VIII deficiency: Secondary | ICD-10-CM

## 2013-02-02 ENCOUNTER — Ambulatory Visit: Payer: Medicare Other | Admitting: Oncology

## 2013-02-02 ENCOUNTER — Other Ambulatory Visit: Payer: Medicare Other

## 2013-02-13 ENCOUNTER — Other Ambulatory Visit: Payer: Self-pay | Admitting: *Deleted

## 2013-02-13 ENCOUNTER — Telehealth: Payer: Self-pay | Admitting: *Deleted

## 2013-02-13 DIAGNOSIS — D66 Hereditary factor VIII deficiency: Secondary | ICD-10-CM

## 2013-02-13 LAB — IFOBT (OCCULT BLOOD): IFOBT: POSITIVE

## 2013-02-13 MED ORDER — MORPHINE SULFATE 30 MG PO TABS: 30.0000 mg | ORAL_TABLET | ORAL | Status: DC | PRN

## 2013-02-13 NOTE — Telephone Encounter (Signed)
Patient called requesting a refill on morphine 30 mg.  Reports being almost out of this medicine.  Noted last fill was 01-02-2013 for number 180.  Takes 1 po q4 hrs prn hemophilia crisis.  No follow up appointments noted.  Last seen 02-01-2012 and did not show up on 02-02-2013.  Says he did not know about he appointment and just missed it.  Will notify providers.

## 2013-02-13 NOTE — Telephone Encounter (Signed)
Request for narcotic refill brought to this RN per triage due to pt was FTKA on 02/02/2013.   Noted pt was unaware of appointment for yearly follow up for hemophilia which is diagnosis MD treats with morphine.  Refill obtained and request to reschedule appointment sent to scheduling.

## 2013-02-14 ENCOUNTER — Telehealth: Payer: Self-pay | Admitting: *Deleted

## 2013-02-14 NOTE — Telephone Encounter (Signed)
sw pt gv aptt for 03/02/13 w/ labs@ 1:30pm and ov@ 2pm.i also made pt aware that i will mail a letter/cal as well...td

## 2013-03-02 ENCOUNTER — Ambulatory Visit (HOSPITAL_BASED_OUTPATIENT_CLINIC_OR_DEPARTMENT_OTHER): Payer: Medicare Other | Admitting: Oncology

## 2013-03-02 ENCOUNTER — Telehealth: Payer: Self-pay | Admitting: Oncology

## 2013-03-02 ENCOUNTER — Other Ambulatory Visit (HOSPITAL_BASED_OUTPATIENT_CLINIC_OR_DEPARTMENT_OTHER): Payer: Medicare Other

## 2013-03-02 VITALS — BP 136/65 | HR 60 | Temp 98.3°F | Resp 20 | Ht 68.0 in | Wt 164.0 lb

## 2013-03-02 DIAGNOSIS — D66 Hereditary factor VIII deficiency: Secondary | ICD-10-CM

## 2013-03-02 LAB — CBC WITH DIFFERENTIAL/PLATELET
BASO%: 0.6 % (ref 0.0–2.0)
EOS%: 1.8 % (ref 0.0–7.0)
LYMPH%: 35.6 % (ref 14.0–49.0)
MCHC: 34.1 g/dL (ref 32.0–36.0)
MONO#: 0.6 10*3/uL (ref 0.1–0.9)
MONO%: 9.2 % (ref 0.0–14.0)
Platelets: 98 10*3/uL — ABNORMAL LOW (ref 140–400)
RBC: 4.05 10*6/uL — ABNORMAL LOW (ref 4.20–5.82)
WBC: 6.9 10*3/uL (ref 4.0–10.3)

## 2013-03-02 NOTE — Telephone Encounter (Signed)
,  k 

## 2013-03-02 NOTE — Progress Notes (Signed)
ID: [name]   DOB: 10-02-1930  MR#: [id number]  QMV#:[id number]   PCP: Gaspar Garbe, MD GYN: SU:  OTHER MD: Viann Fish, Kerry Dory  INTERVAL HISTORY: [name] returns for followup of his hemophilia. The interval history is generally unremarkable. He just turned 79. He tells me he has about 23 grandchildren at this point but "I'm losing count".  REVIEW OF SYSTEMS: There has been no overt bleeding and specifically no epistaxis, hematemesis, or hematochezia. He hurts every day, some days more than others, but his use of analgesics has been very stable. He has developed some angina and is under treatment for this by Dr. Donnie Aho, but [name] does not know what medications he was put on. He is going to bring them at his next visit here. Aside from the joint deformity pain and the angina, he feels a little forgetful but a detailed review of systems was otherwise stable. He spends most of his time at home taking care of his wife who has significant back problems, taking around in the yard, and try to fix the car. A detailed review of systems was otherwise noncontributory  PAST MEDICAL HISTORY: Significant for hemophilia A., moderate; multiple hemarthroses, with chronic pain syndrome; history of cataracts, GERD, borderline glucose intolerance, and possible remote NSTEMI  FAMILY HISTORY His father died at the age of 15 with prostate cancer. His mother died at the age of 86. He has 3 brothers and one sister. There is no history of hemophilia or other cancer in the family.  SOCIAL HISTORY: He used to work in a loading dock. His wife of 54 years, [name], used to work in [place]. The patient has 5 children, one in [place] one in [place] one in [place] one in [place] and one in [place]. He has more than 20 grandchildren. He attends a [employer].  ADVANCED DIRECTIVES: Not in place  HEALTH MAINTENANCE: History  Substance Use Topics  . Smoking status: Not on file  . Smokeless  tobacco: Not on file  . Alcohol Use: Not on file     Colonoscopy:  PAP:  Bone density:  Lipid panel:  Allergies  Allergen Reactions  . Aspirin Other (See Comments)    Increased bleeding per hemchromatosis  . Ibuprofen Other (See Comments)    Bleeding per hemechromatosis  . Prednisone Other (See Comments)    Bleeding per hemechromatosis    Current Outpatient Prescriptions  Medication Sig Dispense Refill  . morphine (MSIR) 30 MG tablet Take 1 tablet (30 mg total) by mouth every 4 (four) hours as needed for pain (uses for hemaphilia crisis).  180 tablet  0  . polyethylene glycol (MIRALAX / GLYCOLAX) packet Take 17 g by mouth daily as needed.       No current facility-administered medications for this visit.    OBJECTIVE:  Elderly white male in no acute distress Filed Vitals:   03/02/13 1349  BP: 136/65  Pulse: 60  Temp: 98.3 F (36.8 C)  Resp: 20     Body mass index is 24.94 kg/(m^2).    ECOG FS: 2  Sclerae unicteric Oropharynx clear No peripheral adenopathy Lungs no rales or rhonchi Heart regular rate and rhythm, no murmur appreciated Abd benign, no splenomegaly palpated MSK  Multiple joint deformities, as previously noted. No acute hemarthrosis Neuro: nonfocal, well oriented, positive affect  LAB RESULTS: Lab Results  Component Value Date   WBC 6.9 03/02/2013   NEUTROABS 3.6 03/02/2013   HGB 12.5* 03/02/2013   HCT 36.7*  03/02/2013   MCV 90.7 03/02/2013   PLT 98* 03/02/2013      Chemistry   No results found for this basename: NA,  K,  CL,  CO2,  BUN,  CREATININE,  GLU   No results found for this basename: CALCIUM,  ALKPHOS,  AST,  ALT,  BILITOT       No results found for this basename: LABCA2    No components found with this basename: LABCA125    No results found for this basename: INR,  in the last 168 hours  Urinalysis No results found for this basename: colorurine,  appearanceur,  labspec,  phurine,  glucoseu,  hgbur,  bilirubinur,  ketonesur,  proteinur,   urobilinogen,  nitrite,  leukocytesur    STUDIES: No results found.   ASSESSMENT: 79 y.o. [place] man with a history of hemophilia A and a chronic pain syndrome secondary to multiple prior fractures and hemarthroses.   PLAN: [name] is very stable, and I am making no changes in his treatment, which is essentially pain control. He knows to contact does or [facility] if acute bleeding develops. He will update his medicine list next time he drops in to get his morphine prescriptions. He knows to call for any other problems that may develop before the next visit.   Margurete Guaman C    03/02/2013

## 2013-04-03 ENCOUNTER — Other Ambulatory Visit: Payer: Self-pay | Admitting: *Deleted

## 2013-04-03 DIAGNOSIS — D66 Hereditary factor VIII deficiency: Secondary | ICD-10-CM

## 2013-04-03 MED ORDER — MORPHINE SULFATE 30 MG PO TABS: 30.0000 mg | ORAL_TABLET | ORAL | Status: DC | PRN

## 2013-05-25 ENCOUNTER — Other Ambulatory Visit: Payer: Self-pay | Admitting: *Deleted

## 2013-05-25 DIAGNOSIS — D66 Hereditary factor VIII deficiency: Secondary | ICD-10-CM

## 2013-05-25 MED ORDER — MORPHINE SULFATE 30 MG PO TABS: 30.0000 mg | ORAL_TABLET | ORAL | Status: DC | PRN

## 2013-06-26 ENCOUNTER — Other Ambulatory Visit: Payer: Self-pay | Admitting: *Deleted

## 2013-06-26 DIAGNOSIS — D66 Hereditary factor VIII deficiency: Secondary | ICD-10-CM

## 2013-06-26 MED ORDER — MORPHINE SULFATE 30 MG PO TABS: 30.0000 mg | ORAL_TABLET | ORAL | Status: DC | PRN

## 2013-06-26 NOTE — Telephone Encounter (Signed)
Patient calling in to request refill. Will call when approved/ready for pickup by Dr Darnelle Catalan.

## 2013-08-07 ENCOUNTER — Other Ambulatory Visit: Payer: Self-pay | Admitting: *Deleted

## 2013-08-07 DIAGNOSIS — D66 Hereditary factor VIII deficiency: Secondary | ICD-10-CM

## 2013-08-07 MED ORDER — MORPHINE SULFATE 30 MG PO TABS: 30.0000 mg | ORAL_TABLET | ORAL | Status: DC | PRN

## 2013-09-28 ENCOUNTER — Telehealth: Payer: Self-pay | Admitting: *Deleted

## 2013-09-28 ENCOUNTER — Other Ambulatory Visit: Payer: Self-pay | Admitting: *Deleted

## 2013-09-28 DIAGNOSIS — D66 Hereditary factor VIII deficiency: Secondary | ICD-10-CM

## 2013-09-28 MED ORDER — MORPHINE SULFATE 30 MG PO TABS: 30.0000 mg | ORAL_TABLET | ORAL | Status: DC | PRN

## 2013-09-28 NOTE — Telephone Encounter (Signed)
Pt called requesting refill of Morphine 30 mg every 4 hours as needed for pain.  Pt stated he would like to pick up prescription on 09/29/13. Pt's  Phone   [phone].

## 2013-11-16 ENCOUNTER — Other Ambulatory Visit: Payer: Self-pay | Admitting: *Deleted

## 2013-11-16 DIAGNOSIS — D66 Hereditary factor VIII deficiency: Secondary | ICD-10-CM

## 2013-11-16 MED ORDER — MORPHINE SULFATE 30 MG PO TABS: 30.0000 mg | ORAL_TABLET | ORAL | Status: DC | PRN

## 2014-01-08 ENCOUNTER — Other Ambulatory Visit: Payer: Self-pay | Admitting: *Deleted

## 2014-01-08 DIAGNOSIS — D66 Hereditary factor VIII deficiency: Secondary | ICD-10-CM

## 2014-01-08 MED ORDER — MORPHINE SULFATE 30 MG PO TABS: 30.0000 mg | ORAL_TABLET | ORAL | Status: DC | PRN

## 2014-02-05 ENCOUNTER — Telehealth: Payer: Self-pay | Admitting: *Deleted

## 2014-02-05 NOTE — Telephone Encounter (Signed)
Called to r/s f/u with Dr. Jana Hakim.  Confirmed new appt date and time for 04/04/14 at 1330 for lab and 1400 to see Dr. Jana Hakim.  Pt denies further needs at this time.

## 2014-02-14 ENCOUNTER — Other Ambulatory Visit: Payer: Self-pay | Admitting: *Deleted

## 2014-02-14 DIAGNOSIS — D66 Hereditary factor VIII deficiency: Secondary | ICD-10-CM

## 2014-02-14 MED ORDER — MORPHINE SULFATE 30 MG PO TABS: 30.0000 mg | ORAL_TABLET | ORAL | Status: DC | PRN

## 2014-03-05 ENCOUNTER — Ambulatory Visit: Payer: Medicare Other | Admitting: Oncology

## 2014-03-05 ENCOUNTER — Other Ambulatory Visit: Payer: Medicare Other

## 2014-04-03 ENCOUNTER — Other Ambulatory Visit: Payer: Self-pay

## 2014-04-03 DIAGNOSIS — D66 Hereditary factor VIII deficiency: Secondary | ICD-10-CM

## 2014-04-04 ENCOUNTER — Other Ambulatory Visit (HOSPITAL_BASED_OUTPATIENT_CLINIC_OR_DEPARTMENT_OTHER): Payer: Medicare Other

## 2014-04-04 ENCOUNTER — Ambulatory Visit (HOSPITAL_BASED_OUTPATIENT_CLINIC_OR_DEPARTMENT_OTHER): Payer: Medicare Other | Admitting: Oncology

## 2014-04-04 ENCOUNTER — Telehealth: Payer: Self-pay | Admitting: Oncology

## 2014-04-04 VITALS — BP 131/55 | HR 64 | Temp 98.3°F | Resp 18 | Ht 68.0 in | Wt 158.4 lb

## 2014-04-04 DIAGNOSIS — D66 Hereditary factor VIII deficiency: Secondary | ICD-10-CM

## 2014-04-04 DIAGNOSIS — G894 Chronic pain syndrome: Secondary | ICD-10-CM

## 2014-04-04 LAB — CBC WITH DIFFERENTIAL/PLATELET
BASO%: 0.3 % (ref 0.0–2.0)
Basophils Absolute: 0 10*3/uL (ref 0.0–0.1)
EOS ABS: 0.1 10*3/uL (ref 0.0–0.5)
EOS%: 2.1 % (ref 0.0–7.0)
HCT: 35.7 % — ABNORMAL LOW (ref 38.4–49.9)
HGB: 12.1 g/dL — ABNORMAL LOW (ref 13.0–17.1)
LYMPH#: 2.2 10*3/uL (ref 0.9–3.3)
LYMPH%: 35.3 % (ref 14.0–49.0)
MCH: 31.1 pg (ref 27.2–33.4)
MCHC: 33.9 g/dL (ref 32.0–36.0)
MCV: 91.8 fL (ref 79.3–98.0)
MONO#: 0.7 10*3/uL (ref 0.1–0.9)
MONO%: 11.4 % (ref 0.0–14.0)
NEUT%: 50.9 % (ref 39.0–75.0)
NEUTROS ABS: 3.1 10*3/uL (ref 1.5–6.5)
PLATELETS: 91 10*3/uL — AB (ref 140–400)
RBC: 3.89 10*6/uL — ABNORMAL LOW (ref 4.20–5.82)
RDW: 14.5 % (ref 11.0–14.6)
WBC: 6.1 10*3/uL (ref 4.0–10.3)
nRBC: 0 % (ref 0–0)

## 2014-04-04 NOTE — Telephone Encounter (Signed)
cld & spoke to pt in re to appt-adv pt I woild mail copy of sch-pt understood

## 2014-04-04 NOTE — Progress Notes (Signed)
ID: [name]   DOB: [birth year]  MR#: [id number]  WOE#:[id number]   PCP: Haywood Pao, MD GYN: SU:  OTHER MD: Tollie Eth, Sterling Big  INTERVAL HISTORY: [name] returns today for follow up of his hemophilia.The interval history is unremarkable. His main complaint today is having to pay $70+ for his morphine at the pharmacy. He says he has had to do this at least the last 2 times he has refilled his prescription. Previously he only paid around $7.   REVIEW OF SYSTEMS: [name] denies any episodes of epistaxis, hematemesis, or hematochezia. During his physical with his PCP this year, his stool was positive using a hemoccult test, but he has not noticed any gross bleeding. His pain is generalized and constant, experiencing it all day. All of his joints ache, it impairs his gait, and he is heavily reliant on a cane to ambulate. He does not schedule his doses of morphine, but truly uses it PRN. Some days he uses as little as 2 pills per day, other times much more than this. He is still under the care of Dr. Wynonia Lawman, for his angina. His last episode of this nature was about 6 weeks ago, and he says they occur this frequently year around. They only last for a couple seconds to a full minute and it is relieved with cessation in activity. He is used to being more active so this is frustrating for him. A detailed review of systems was otherwise negative.  PAST MEDICAL HISTORY: Significant for hemophilia A., moderate; multiple hemarthroses, with chronic pain syndrome; history of cataracts, GERD, borderline glucose intolerance, and possible remote NSTEMI  FAMILY HISTORY His father died at the age of 65 with prostate cancer. His mother died at the age of 31. He has 3 brothers and one sister. There is no history of hemophilia or other cancer in the family.  SOCIAL HISTORY: He used to work in a loading dock. His wife of 74 years, [name], used to work in [place]. The patient has 5 children, one in [place]  one in [place] one in [place] one in [place] and one in [place]. He has more than 20 grandchildren. He attends a [employer].  ADVANCED DIRECTIVES: Not in place  HEALTH MAINTENANCE: History  Substance Use Topics  . Smoking status: Not on file  . Smokeless tobacco: Not on file  . Alcohol Use: Not on file    Allergies  Allergen Reactions  . Aspirin Other (See Comments)    Increased bleeding per hemchromatosis  . Ibuprofen Other (See Comments)    Bleeding per hemechromatosis  . Prednisone Other (See Comments)    Bleeding per hemechromatosis    Current Outpatient Prescriptions  Medication Sig Dispense Refill  . morphine (MSIR) 30 MG tablet Take 1 tablet (30 mg total) by mouth every 4 (four) hours as needed.  180 tablet  0  . polyethylene glycol (MIRALAX / GLYCOLAX) packet Take 17 g by mouth daily as needed.       No current facility-administered medications for this visit.    OBJECTIVE:  Elderly white male in no acute distress Filed Vitals:   04/04/14 1352  BP: 131/55  Pulse: 64  Temp: 98.3 F (36.8 C)  Resp: 18     Body mass index is 24.09 kg/(m^2).    ECOG FS: 2  Skin: warm, dry  HEENT: sclerae anicteric, conjunctivae pink, oropharynx clear. No thrush or mucositis.  Lymph Nodes: No cervical or supraclavicular lymphadenopathy  Lungs: clear to auscultation  bilaterally, no rales, wheezes, or rhonci  Heart: regular rate and rhythm  Abdomen: round, soft, non tender, positive bowel sounds  Musculoskeletal: no acute hemarthrosis, multiple joint deformities, gait impaired Neuro: non focal, well oriented, positive affect    LAB RESULTS: Lab Results  Component Value Date   WBC 6.1 04/04/2014   NEUTROABS 3.1 04/04/2014   HGB 12.1* 04/04/2014   HCT 35.7* 04/04/2014   MCV 91.8 04/04/2014   PLT 91* 04/04/2014      Chemistry   No results found for this basename: NA,  K,  CL,  CO2,  BUN,  CREATININE,  GLU   No results found for this basename: CALCIUM,  ALKPHOS,  AST,   ALT,  BILITOT       No results found for this basename: LABCA2    No components found with this basename: LABCA125    No results found for this basename: INR,  in the last 168 hours  Urinalysis No results found for this basename: colorurine,  appearanceur,  labspec,  phurine,  glucoseu,  hgbur,  bilirubinur,  ketonesur,  proteinur,  urobilinogen,  nitrite,  leukocytesur    STUDIES: No results found.  ASSESSMENT:  80 y.o. [name] man with a history of hemophilia A and a chronic pain syndrome secondary to multiple prior fractures and hemarthroses.   PLAN:  [name]'s condition is stable, the there will be no change in the plan. We will continue to control his pain on the morphine. We made a call to his pharmacy, [facility], to inquire about the increased cost for this medication. Their response was that the last time he paid $7 for this medicine, it was some time in 2013. They are unsure why, even with insurance, this copay is so high. They suggest following up with his insurance company, [employer], to investigate further. Our pharmacy here at [facility] quoted a similar price. The patient has about 20 pills left, enough to last him through the week, before he has to obtain a refill. The details of this situation were explained to my nurse. She gave him instructions on who to call and will check back in with him by Friday to see where he stands.   [name] understands and agrees with the plan. He knows to contact [facility] if acute bleeding occurs. We will see him next year for labs and an office visit. He has been encouraged to call with any issues that arise before his next visit.    [name]    04/04/2014

## 2014-04-06 ENCOUNTER — Other Ambulatory Visit: Payer: Self-pay | Admitting: *Deleted

## 2014-04-06 ENCOUNTER — Encounter: Payer: Self-pay | Admitting: *Deleted

## 2014-04-06 DIAGNOSIS — D66 Hereditary factor VIII deficiency: Secondary | ICD-10-CM

## 2014-04-06 MED ORDER — MORPHINE SULFATE 30 MG PO TABS: 30.0000 mg | ORAL_TABLET | ORAL | Status: DC | PRN

## 2014-04-06 NOTE — Telephone Encounter (Signed)
Form completed for [employer] for medical necessity for Morphine 30mg .  This RN completed and faxed per need to maintain current regimen.

## 2014-04-06 NOTE — Progress Notes (Signed)
This RN faxed Opioids Quantity Limitation Request Form to [employer]. Fax # [phone].

## 2014-04-09 NOTE — Progress Notes (Signed)
NEKIA FROM [employer] CALLED AND SAID HIS MORPHINE SULPHATE HAS BEEN APPROVED FOR ONE YEAR STARTING 04/09/14 TO 04/09/15.  THEY WILL SEND [place] A LETTER.

## 2014-05-15 ENCOUNTER — Other Ambulatory Visit: Payer: Self-pay | Admitting: Emergency Medicine

## 2014-05-15 DIAGNOSIS — D66 Hereditary factor VIII deficiency: Secondary | ICD-10-CM

## 2014-05-15 MED ORDER — MORPHINE SULFATE 30 MG PO TABS: 30.0000 mg | ORAL_TABLET | ORAL | Status: DC | PRN

## 2014-06-27 ENCOUNTER — Other Ambulatory Visit: Payer: Self-pay | Admitting: *Deleted

## 2014-06-27 DIAGNOSIS — D66 Hereditary factor VIII deficiency: Secondary | ICD-10-CM

## 2014-06-27 MED ORDER — MORPHINE SULFATE 30 MG PO TABS: 30.0000 mg | ORAL_TABLET | ORAL | Status: DC | PRN

## 2014-08-12 ENCOUNTER — Telehealth: Payer: Self-pay | Admitting: Oncology

## 2014-08-12 NOTE — Telephone Encounter (Signed)
s.w. pt and advosed on 8.11. appt moved to 8.18 due to MD on pal..Marland KitchenMarland KitchenMarland Kitchenpt ok adna ware

## 2014-08-13 ENCOUNTER — Other Ambulatory Visit: Payer: Self-pay | Admitting: *Deleted

## 2014-08-13 DIAGNOSIS — D66 Hereditary factor VIII deficiency: Secondary | ICD-10-CM

## 2014-08-13 MED ORDER — MORPHINE SULFATE 30 MG PO TABS: 30.0000 mg | ORAL_TABLET | ORAL | Status: DC | PRN

## 2014-09-25 ENCOUNTER — Other Ambulatory Visit: Payer: Self-pay | Admitting: *Deleted

## 2014-09-25 DIAGNOSIS — D66 Hereditary factor VIII deficiency: Secondary | ICD-10-CM

## 2014-09-25 MED ORDER — MORPHINE SULFATE 30 MG PO TABS: 30.0000 mg | ORAL_TABLET | ORAL | Status: DC | PRN

## 2014-11-12 ENCOUNTER — Other Ambulatory Visit: Payer: Self-pay | Admitting: Emergency Medicine

## 2014-11-12 ENCOUNTER — Telehealth: Payer: Self-pay | Admitting: *Deleted

## 2014-11-12 DIAGNOSIS — D66 Hereditary factor VIII deficiency: Secondary | ICD-10-CM

## 2014-11-12 MED ORDER — MORPHINE SULFATE 30 MG PO TABS: 30.0000 mg | ORAL_TABLET | ORAL | Status: DC | PRN

## 2014-11-12 NOTE — Telephone Encounter (Signed)
[name] called requesting refill for MSIR 30 mg tablets.  Last filled 09-25-2014 for quantity of 180 pills.  Will notify provider of this request.  Patient would like to pick up today and a call when ready for pick up.  Return number [phone].  This request printed and to provider/collaborative in basket for review.

## 2014-12-31 ENCOUNTER — Other Ambulatory Visit: Payer: Self-pay

## 2014-12-31 DIAGNOSIS — D66 Hereditary factor VIII deficiency: Secondary | ICD-10-CM

## 2014-12-31 MED ORDER — MORPHINE SULFATE 30 MG PO TABS: 30.0000 mg | ORAL_TABLET | ORAL | Status: DC | PRN

## 2015-01-24 DIAGNOSIS — I2510 Atherosclerotic heart disease of native coronary artery without angina pectoris: Secondary | ICD-10-CM | POA: Diagnosis not present

## 2015-01-24 DIAGNOSIS — E119 Type 2 diabetes mellitus without complications: Secondary | ICD-10-CM | POA: Diagnosis not present

## 2015-01-24 DIAGNOSIS — I10 Essential (primary) hypertension: Secondary | ICD-10-CM | POA: Diagnosis not present

## 2015-01-24 DIAGNOSIS — Z125 Encounter for screening for malignant neoplasm of prostate: Secondary | ICD-10-CM | POA: Diagnosis not present

## 2015-01-29 DIAGNOSIS — R809 Proteinuria, unspecified: Secondary | ICD-10-CM | POA: Diagnosis not present

## 2015-01-29 DIAGNOSIS — Z1212 Encounter for screening for malignant neoplasm of rectum: Secondary | ICD-10-CM | POA: Diagnosis not present

## 2015-01-30 DIAGNOSIS — D66 Hereditary factor VIII deficiency: Secondary | ICD-10-CM | POA: Diagnosis not present

## 2015-01-30 DIAGNOSIS — I2510 Atherosclerotic heart disease of native coronary artery without angina pectoris: Secondary | ICD-10-CM | POA: Diagnosis not present

## 2015-01-30 DIAGNOSIS — D692 Other nonthrombocytopenic purpura: Secondary | ICD-10-CM | POA: Diagnosis not present

## 2015-01-30 DIAGNOSIS — Z0000 Encounter for general adult medical examination without abnormal findings: Secondary | ICD-10-CM | POA: Diagnosis not present

## 2015-01-30 DIAGNOSIS — M1990 Unspecified osteoarthritis, unspecified site: Secondary | ICD-10-CM | POA: Diagnosis not present

## 2015-01-30 DIAGNOSIS — I10 Essential (primary) hypertension: Secondary | ICD-10-CM | POA: Diagnosis not present

## 2015-01-30 DIAGNOSIS — E119 Type 2 diabetes mellitus without complications: Secondary | ICD-10-CM | POA: Diagnosis not present

## 2015-01-30 DIAGNOSIS — N400 Enlarged prostate without lower urinary tract symptoms: Secondary | ICD-10-CM | POA: Diagnosis not present

## 2015-02-21 ENCOUNTER — Telehealth: Payer: Self-pay | Admitting: *Deleted

## 2015-02-21 ENCOUNTER — Other Ambulatory Visit: Payer: Self-pay

## 2015-02-21 DIAGNOSIS — D66 Hereditary factor VIII deficiency: Secondary | ICD-10-CM

## 2015-02-21 MED ORDER — MORPHINE SULFATE 30 MG PO TABS: 30.0000 mg | ORAL_TABLET | ORAL | Status: DC | PRN

## 2015-02-21 NOTE — Telephone Encounter (Signed)
Refill printed.  Pt notified ready for pickup - voiced understanding.

## 2015-02-21 NOTE — Telephone Encounter (Signed)
Patient called requesting refill on the morphine/MSIR 30 mg last order was 12-31-2014.  Would like a return call when script ready for pick up.  Return number [phone].

## 2015-04-10 ENCOUNTER — Other Ambulatory Visit: Payer: Medicare Other

## 2015-04-10 ENCOUNTER — Ambulatory Visit: Payer: Medicare Other | Admitting: Oncology

## 2015-04-12 ENCOUNTER — Other Ambulatory Visit: Payer: Self-pay | Admitting: *Deleted

## 2015-04-12 DIAGNOSIS — D66 Hereditary factor VIII deficiency: Secondary | ICD-10-CM

## 2015-04-12 MED ORDER — MORPHINE SULFATE 30 MG PO TABS: 30.0000 mg | ORAL_TABLET | ORAL | Status: DC | PRN

## 2015-04-18 ENCOUNTER — Ambulatory Visit (HOSPITAL_BASED_OUTPATIENT_CLINIC_OR_DEPARTMENT_OTHER): Payer: Commercial Managed Care - HMO | Admitting: Oncology

## 2015-04-18 ENCOUNTER — Telehealth: Payer: Self-pay | Admitting: Oncology

## 2015-04-18 ENCOUNTER — Other Ambulatory Visit (HOSPITAL_BASED_OUTPATIENT_CLINIC_OR_DEPARTMENT_OTHER): Payer: Commercial Managed Care - HMO

## 2015-04-18 VITALS — BP 126/60 | HR 68 | Temp 98.2°F | Resp 18 | Wt 153.4 lb

## 2015-04-18 DIAGNOSIS — I209 Angina pectoris, unspecified: Secondary | ICD-10-CM | POA: Diagnosis not present

## 2015-04-18 DIAGNOSIS — E785 Hyperlipidemia, unspecified: Secondary | ICD-10-CM

## 2015-04-18 DIAGNOSIS — G8929 Other chronic pain: Secondary | ICD-10-CM | POA: Insufficient documentation

## 2015-04-18 DIAGNOSIS — D66 Hereditary factor VIII deficiency: Secondary | ICD-10-CM | POA: Diagnosis not present

## 2015-04-18 LAB — CBC WITH DIFFERENTIAL/PLATELET
BASO%: 0.4 % (ref 0.0–2.0)
Basophils Absolute: 0 10*3/uL (ref 0.0–0.1)
EOS ABS: 0.1 10*3/uL (ref 0.0–0.5)
EOS%: 1.2 % (ref 0.0–7.0)
HCT: 33 % — ABNORMAL LOW (ref 38.4–49.9)
HEMOGLOBIN: 10.9 g/dL — AB (ref 13.0–17.1)
LYMPH#: 1.2 10*3/uL (ref 0.9–3.3)
LYMPH%: 22.3 % (ref 14.0–49.0)
MCH: 29.8 pg (ref 27.2–33.4)
MCHC: 33.1 g/dL (ref 32.0–36.0)
MCV: 90.1 fL (ref 79.3–98.0)
MONO#: 0.5 10*3/uL (ref 0.1–0.9)
MONO%: 9 % (ref 0.0–14.0)
NEUT#: 3.5 10*3/uL (ref 1.5–6.5)
NEUT%: 67.1 % (ref 39.0–75.0)
PLATELETS: 102 10*3/uL — AB (ref 140–400)
RBC: 3.66 10*6/uL — ABNORMAL LOW (ref 4.20–5.82)
RDW: 14.5 % (ref 11.0–14.6)
WBC: 5.2 10*3/uL (ref 4.0–10.3)

## 2015-04-18 NOTE — Progress Notes (Signed)
ID: [name]   DOB: [birth year]  MR#: [id number]  THY#:[id number]   PCP: Joshua Pao, MD GYN: SU:  OTHER MD: [name], [name]  INTERVAL HISTORY: [name] returns today for follow up of his hemophilia and chronic pain. Things are generally stable but he tells me about 2 months ago he developed a bleed in the right elbow area and had significant arm pain, which required his taking some extra morphine. He did not bring this problem to medical attention.  REVIEW OF SYSTEMS: [name] tells me otherwise his pain is well managed. Constipation is "not bad" and he has a variety of stool softeners and milder laxatives that he uses on an as-needed basis. He admits to feeling tired and he is currently the primary caregiver for his wife, who was in rehabilitation for a long time but is now back home. He tells [name] is blurred at times. His dentures fit poorly. He has some heartburn. He has some urinary dribbling at times. There has been no blood in his urine. A detailed review of systems today was otherwise stable  PAST MEDICAL HISTORY: Significant for hemophilia A., moderate; multiple hemarthroses, with chronic pain syndrome; history of cataracts, GERD, borderline glucose intolerance, and possible remote NSTEMI  FAMILY HISTORY His father died at the age of 70 with prostate cancer. His mother died at the age of 42. He has 3 brothers and one sister. There is no history of hemophilia or other cancer in the family.  SOCIAL HISTORY: He used to work in a loading dock. His wife of 29 years, [name], used to work in [place]. The patient has 5 children, one in [place] one in [place] one in [place] one in [place] and one in [place]. He has more than 20 grandchildren. He attends a [employer].  ADVANCED DIRECTIVES: Not in place  HEALTH MAINTENANCE: Social History  Substance Use Topics  . Smoking status: Not on file  . Smokeless tobacco: Not on file  . Alcohol Use: Not on file     Allergies  Allergen Reactions  . Aspirin Other (See Comments)    Increased bleeding per hemchromatosis  . Ibuprofen Other (See Comments)    Bleeding per hemechromatosis  . Prednisone Other (See Comments)    Bleeding per hemechromatosis    Current Outpatient Prescriptions  Medication Sig Dispense Refill  . atenolol (TENORMIN) 50 MG tablet Take 50 mg by mouth 2 (two) times daily.    Marland Kitchen atorvastatin (LIPITOR) 20 MG tablet Take 20 mg by mouth daily.    Marland Kitchen morphine (MSIR) 30 MG tablet Take 1 tablet (30 mg total) by mouth every 4 (four) hours as needed. 180 tablet 0  . nitroGLYCERIN (NITROSTAT) 0.4 MG SL tablet Place 0.4 mg under the tongue every 5 (five) minutes as needed for chest pain.    . polyethylene glycol (MIRALAX / GLYCOLAX) packet Take 17 g by mouth daily as needed.     No current facility-administered medications for this visit.    OBJECTIVE:  Elderly white male with multiple severe joint deformities and significant kyphosis Filed Vitals:   04/18/15 1312  BP: 126/60  Pulse: 68  Temp: 98.2 F (36.8 Serrano)  Resp: 18     Body mass index is 23.33 kg/(m^2).    ECOG FS: 2  Sclerae unicteric, EOMs intact Oropharynx clear, slightly dry No cervical or supraclavicular adenopathy Lungs no rales or rhonchi Heart regular rate and rhythm Abd soft, nontender, positive bowel sounds MSK severe kyphosis and scoliosis  but no focal spinal tenderness, other joint deformities as before Neuro: nonfocal, well oriented, positive affect   LAB RESULTS: Lab Results  Component Value Date   WBC 5.2 04/18/2015   NEUTROABS 3.5 04/18/2015   HGB 10.9* 04/18/2015   HCT 33.0* 04/18/2015   MCV 90.1 04/18/2015   PLT 102* 04/18/2015      Chemistry   No results found for: NA No results found for: CALCIUM     No results found for: LABCA2  No components found for: XYVOP929  No results for input(s): INR in the last 168 hours.  Urinalysis No results found for: COLORURINE  STUDIES: No  results found.   ASSESSMENT:  81 y.o. [name] man with a history of hemophilia A and a chronic pain syndrome secondary to multiple prior fractures and hemarthroses.   PLAN:  [name] is very stable and overall is doing very well as far as his hemophilia is concerned. He did not report to [facility] as instructed when he had the bleeding in the right elbow joint. Fortunately those symptoms are better.  He continues to have severe chronic pain because of his multiple hemarthrosis problems, but continues on a very stable dose of MS IR. He is able to avoid constipation by taking a variety of stool softeners and laxatives somewhat irregularly.  Our role with [name] is really to assist in pain management. Of course we would be available if the bleeding emergency developed. For his chronic hemophilia management however he reports to [facility] and I urged him not to lose contact with his physicians there.  He will see me again in one year. He knows to call for any problems that may develop before that visit. [name]    04/18/2015

## 2015-04-18 NOTE — Telephone Encounter (Signed)
Appointments made and avs printed for patient °

## 2015-05-27 ENCOUNTER — Telehealth: Payer: Self-pay | Admitting: *Deleted

## 2015-05-27 NOTE — Telephone Encounter (Signed)
This patient belongs to [facility].  VG

## 2015-05-27 NOTE — Telephone Encounter (Signed)
Call from patient "requesting prescription for morphine sulfate 30 mg.  I'll pick this up tomorrow morning."  Forwarded request to collaborative.

## 2015-05-28 ENCOUNTER — Telehealth: Payer: Self-pay

## 2015-05-28 DIAGNOSIS — D66 Hereditary factor VIII deficiency: Secondary | ICD-10-CM

## 2015-05-28 DIAGNOSIS — G8929 Other chronic pain: Secondary | ICD-10-CM

## 2015-05-28 MED ORDER — MORPHINE SULFATE 30 MG PO TABS: 30.0000 mg | ORAL_TABLET | ORAL | Status: DC | PRN

## 2015-05-28 NOTE — Telephone Encounter (Signed)
Please complete scrpts as per routine

## 2015-05-28 NOTE — Telephone Encounter (Signed)
Patient called requesting refills on his morphine.  Ok'd by Gentry Fitz, NP.  Patient's wife picked up the prescription today.

## 2015-07-16 ENCOUNTER — Telehealth: Payer: Self-pay | Admitting: *Deleted

## 2015-07-16 ENCOUNTER — Other Ambulatory Visit: Payer: Self-pay

## 2015-07-16 DIAGNOSIS — G8929 Other chronic pain: Secondary | ICD-10-CM

## 2015-07-16 DIAGNOSIS — D66 Hereditary factor VIII deficiency: Secondary | ICD-10-CM

## 2015-07-16 MED ORDER — MORPHINE SULFATE 30 MG PO TABS: 30.0000 mg | ORAL_TABLET | ORAL | Status: DC | PRN

## 2015-07-16 NOTE — Telephone Encounter (Signed)
Voicemail: "I need to get my prescription medication."  Patient referring to [facility] which was last ordered 05-28-2015.

## 2015-07-16 NOTE — Telephone Encounter (Signed)
Medication refilled- morphine 30 mg.  Patient notified, he will pick the prescription up today.

## 2015-09-09 ENCOUNTER — Other Ambulatory Visit: Payer: Self-pay | Admitting: *Deleted

## 2015-09-09 DIAGNOSIS — D66 Hereditary factor VIII deficiency: Secondary | ICD-10-CM

## 2015-09-09 DIAGNOSIS — G8929 Other chronic pain: Secondary | ICD-10-CM

## 2015-09-09 MED ORDER — MORPHINE SULFATE 30 MG PO TABS: 30.0000 mg | ORAL_TABLET | ORAL | Status: DC | PRN

## 2015-09-12 DIAGNOSIS — N400 Enlarged prostate without lower urinary tract symptoms: Secondary | ICD-10-CM | POA: Diagnosis not present

## 2015-09-12 DIAGNOSIS — I2510 Atherosclerotic heart disease of native coronary artery without angina pectoris: Secondary | ICD-10-CM | POA: Diagnosis not present

## 2015-09-12 DIAGNOSIS — E119 Type 2 diabetes mellitus without complications: Secondary | ICD-10-CM | POA: Diagnosis not present

## 2015-09-12 DIAGNOSIS — R58 Hemorrhage, not elsewhere classified: Secondary | ICD-10-CM | POA: Diagnosis not present

## 2015-09-12 DIAGNOSIS — D66 Hereditary factor VIII deficiency: Secondary | ICD-10-CM | POA: Diagnosis not present

## 2015-09-12 DIAGNOSIS — I252 Old myocardial infarction: Secondary | ICD-10-CM | POA: Diagnosis not present

## 2015-09-12 DIAGNOSIS — R808 Other proteinuria: Secondary | ICD-10-CM | POA: Diagnosis not present

## 2015-09-12 DIAGNOSIS — Z6826 Body mass index (BMI) 26.0-26.9, adult: Secondary | ICD-10-CM | POA: Diagnosis not present

## 2015-09-12 DIAGNOSIS — I209 Angina pectoris, unspecified: Secondary | ICD-10-CM | POA: Diagnosis not present

## 2015-10-21 ENCOUNTER — Other Ambulatory Visit: Payer: Self-pay | Admitting: *Deleted

## 2015-10-21 DIAGNOSIS — G8929 Other chronic pain: Secondary | ICD-10-CM

## 2015-10-21 DIAGNOSIS — D66 Hereditary factor VIII deficiency: Secondary | ICD-10-CM

## 2015-10-21 MED ORDER — MORPHINE SULFATE 30 MG PO TABS: 30.0000 mg | ORAL_TABLET | ORAL | Status: DC | PRN

## 2015-10-29 DIAGNOSIS — I25118 Atherosclerotic heart disease of native coronary artery with other forms of angina pectoris: Secondary | ICD-10-CM | POA: Diagnosis not present

## 2015-10-29 DIAGNOSIS — E785 Hyperlipidemia, unspecified: Secondary | ICD-10-CM | POA: Diagnosis not present

## 2015-10-29 DIAGNOSIS — I119 Hypertensive heart disease without heart failure: Secondary | ICD-10-CM | POA: Diagnosis not present

## 2015-11-28 ENCOUNTER — Telehealth: Payer: Self-pay | Admitting: *Deleted

## 2015-11-28 ENCOUNTER — Other Ambulatory Visit: Payer: Self-pay | Admitting: *Deleted

## 2015-11-28 DIAGNOSIS — D66 Hereditary factor VIII deficiency: Secondary | ICD-10-CM

## 2015-11-28 DIAGNOSIS — G8929 Other chronic pain: Secondary | ICD-10-CM

## 2015-11-28 MED ORDER — MORPHINE SULFATE 30 MG PO TABS: 30.0000 mg | ORAL_TABLET | ORAL | Status: DC | PRN

## 2015-11-28 NOTE — Telephone Encounter (Signed)
"  I need to pick up my Morphine sulfate 30 mg today.  Could (collaborative) nurse call me at [phone] when prescription is ready for pick up."  Voicemail left ext [phone] with this request.

## 2015-11-29 ENCOUNTER — Other Ambulatory Visit: Payer: Self-pay | Admitting: *Deleted

## 2015-11-29 NOTE — Telephone Encounter (Signed)
This RN informed pt prescription ready for pick up.  [name] stated he has concern due to " got a knot in my left breast that I am concerned about and would like Dr Jana Hakim to look at ".  Knot has been present for approximately 1 month " and I was hoping it would go away but it hasn't "  This RN will inform MD of above and return call to pt next week.

## 2016-01-07 ENCOUNTER — Other Ambulatory Visit: Payer: Self-pay | Admitting: Nurse Practitioner

## 2016-01-17 ENCOUNTER — Other Ambulatory Visit: Payer: Self-pay

## 2016-01-17 ENCOUNTER — Telehealth: Payer: Self-pay | Admitting: Oncology

## 2016-01-17 DIAGNOSIS — D66 Hereditary factor VIII deficiency: Secondary | ICD-10-CM

## 2016-01-17 DIAGNOSIS — G8929 Other chronic pain: Secondary | ICD-10-CM

## 2016-01-17 MED ORDER — MORPHINE SULFATE 30 MG PO TABS: 30.0000 mg | ORAL_TABLET | ORAL | Status: DC | PRN

## 2016-01-17 NOTE — Telephone Encounter (Signed)
Patient states he is taking 3-4 tabs daily of morphine 30 mg and his pain is usually 5/10.  He states that Dr. Jana Hakim was possibly going to add a long standing narcotic that he would only have to take every 12 hours. POF placed for an earlier appt.

## 2016-01-17 NOTE — Telephone Encounter (Signed)
s.w. pt and advised on 5.22 appt....pt ok and aware

## 2016-01-20 ENCOUNTER — Telehealth: Payer: Self-pay | Admitting: Nurse Practitioner

## 2016-01-20 ENCOUNTER — Other Ambulatory Visit: Payer: Self-pay | Admitting: Nurse Practitioner

## 2016-01-20 ENCOUNTER — Ambulatory Visit: Payer: Commercial Managed Care - HMO | Admitting: Nurse Practitioner

## 2016-01-20 NOTE — Progress Notes (Signed)
Received permission from patient to move appt from 5/26 to 5/25 at 3:15pm.

## 2016-01-20 NOTE — Telephone Encounter (Signed)
Received permission from patient to move appt from 5/26 to 5/25 at 3:15pm.

## 2016-01-23 ENCOUNTER — Telehealth: Payer: Self-pay | Admitting: Oncology

## 2016-01-23 ENCOUNTER — Ambulatory Visit: Payer: Commercial Managed Care - HMO | Admitting: Nurse Practitioner

## 2016-01-23 NOTE — Telephone Encounter (Signed)
returned call and vm not set up.... °

## 2016-01-24 ENCOUNTER — Ambulatory Visit: Payer: Commercial Managed Care - HMO | Admitting: Nurse Practitioner

## 2016-01-24 ENCOUNTER — Telehealth: Payer: Self-pay | Admitting: *Deleted

## 2016-01-24 ENCOUNTER — Other Ambulatory Visit: Payer: Self-pay | Admitting: Nurse Practitioner

## 2016-01-24 NOTE — Telephone Encounter (Signed)
Called pt to f/u abt missed appt on yesterday. Pt didn't want to commit to being rescheduled until he has time to look at his other appts on his personal calendar. I told pt we will call him next week to r/s this appt.. No further concerns. Message to be fwd to H.Boelter,NP

## 2016-01-29 DIAGNOSIS — E119 Type 2 diabetes mellitus without complications: Secondary | ICD-10-CM | POA: Diagnosis not present

## 2016-01-29 DIAGNOSIS — I10 Essential (primary) hypertension: Secondary | ICD-10-CM | POA: Diagnosis not present

## 2016-01-29 DIAGNOSIS — Z125 Encounter for screening for malignant neoplasm of prostate: Secondary | ICD-10-CM | POA: Diagnosis not present

## 2016-02-03 DIAGNOSIS — Z0000 Encounter for general adult medical examination without abnormal findings: Secondary | ICD-10-CM | POA: Diagnosis not present

## 2016-02-03 DIAGNOSIS — E119 Type 2 diabetes mellitus without complications: Secondary | ICD-10-CM | POA: Diagnosis not present

## 2016-02-03 DIAGNOSIS — I209 Angina pectoris, unspecified: Secondary | ICD-10-CM | POA: Diagnosis not present

## 2016-02-03 DIAGNOSIS — R809 Proteinuria, unspecified: Secondary | ICD-10-CM | POA: Diagnosis not present

## 2016-02-03 DIAGNOSIS — R748 Abnormal levels of other serum enzymes: Secondary | ICD-10-CM | POA: Diagnosis not present

## 2016-02-03 DIAGNOSIS — I252 Old myocardial infarction: Secondary | ICD-10-CM | POA: Diagnosis not present

## 2016-02-03 DIAGNOSIS — F1120 Opioid dependence, uncomplicated: Secondary | ICD-10-CM | POA: Diagnosis not present

## 2016-02-03 DIAGNOSIS — I119 Hypertensive heart disease without heart failure: Secondary | ICD-10-CM | POA: Diagnosis not present

## 2016-02-03 DIAGNOSIS — D692 Other nonthrombocytopenic purpura: Secondary | ICD-10-CM | POA: Diagnosis not present

## 2016-03-05 ENCOUNTER — Other Ambulatory Visit: Payer: Self-pay | Admitting: *Deleted

## 2016-03-05 ENCOUNTER — Telehealth: Payer: Self-pay | Admitting: *Deleted

## 2016-03-05 DIAGNOSIS — G8929 Other chronic pain: Secondary | ICD-10-CM

## 2016-03-05 DIAGNOSIS — D66 Hereditary factor VIII deficiency: Secondary | ICD-10-CM

## 2016-03-05 MED ORDER — MORPHINE SULFATE 30 MG PO TABS: 30.0000 mg | ORAL_TABLET | ORAL | Status: DC | PRN

## 2016-03-05 NOTE — Telephone Encounter (Signed)
"  I need a refill on my morphine medication.  I's like to pick this up tomorrow.  Nurse can call me when ready for pick up at [phone]."

## 2016-04-17 ENCOUNTER — Ambulatory Visit: Payer: Self-pay | Admitting: Nurse Practitioner

## 2016-04-17 ENCOUNTER — Other Ambulatory Visit: Payer: Self-pay

## 2016-04-22 ENCOUNTER — Other Ambulatory Visit: Payer: Self-pay | Admitting: Adult Health

## 2016-04-22 DIAGNOSIS — D66 Hereditary factor VIII deficiency: Secondary | ICD-10-CM

## 2016-04-23 ENCOUNTER — Telehealth: Payer: Self-pay | Admitting: Adult Health

## 2016-04-23 ENCOUNTER — Other Ambulatory Visit: Payer: Self-pay

## 2016-04-23 ENCOUNTER — Ambulatory Visit: Payer: Self-pay | Admitting: Adult Health

## 2016-04-23 NOTE — Telephone Encounter (Signed)
I received a transferred call from Mr. Marsala letting me know that he looked at his appt wrong, thinking he was supposed to arrive at 2:45 pm, instead of 1:45pm. He apologized and expressed that he would like to reschedule this appt. I thanked Mr. Mckee for calling to let us know and I would place a scheduling request to have our schedulers give him a call to get him rescheduled.  He voiced understanding and appreciation.   Mike Craze, NP [place] [phone]

## 2016-04-26 ENCOUNTER — Telehealth: Payer: Self-pay | Admitting: Adult Health

## 2016-04-26 NOTE — Telephone Encounter (Signed)
Called pt to r/s appt missed with Elzie Rings. Pt says they have a lot going on right now and can't r/s at this time. Pt says he will call us to r/s. No appt made.

## 2016-04-29 ENCOUNTER — Telehealth: Payer: Self-pay

## 2016-04-29 DIAGNOSIS — G8929 Other chronic pain: Secondary | ICD-10-CM

## 2016-04-29 DIAGNOSIS — D66 Hereditary factor VIII deficiency: Secondary | ICD-10-CM

## 2016-04-29 MED ORDER — MORPHINE SULFATE 30 MG PO TABS: 30.0000 mg | ORAL_TABLET | ORAL | 0 refills | Status: DC | PRN

## 2016-04-29 NOTE — Telephone Encounter (Signed)
Pt called for morphine refill. He plans to pick it up tomorrow. rx prepared for Dr Jana Hakim signature.

## 2016-06-25 ENCOUNTER — Other Ambulatory Visit: Payer: Self-pay | Admitting: *Deleted

## 2016-06-25 DIAGNOSIS — G8929 Other chronic pain: Secondary | ICD-10-CM

## 2016-06-25 DIAGNOSIS — D66 Hereditary factor VIII deficiency: Secondary | ICD-10-CM

## 2016-06-25 MED ORDER — MORPHINE SULFATE 30 MG PO TABS
30.0000 mg | ORAL_TABLET | ORAL | 0 refills | Status: DC | PRN
Start: 2016-06-25 — End: 2016-08-17

## 2016-08-17 ENCOUNTER — Other Ambulatory Visit: Payer: Self-pay | Admitting: *Deleted

## 2016-08-17 DIAGNOSIS — D66 Hereditary factor VIII deficiency: Secondary | ICD-10-CM

## 2016-08-17 DIAGNOSIS — G8929 Other chronic pain: Secondary | ICD-10-CM

## 2016-08-17 MED ORDER — MORPHINE SULFATE 30 MG PO TABS: 30.0000 mg | ORAL_TABLET | ORAL | 0 refills | Status: DC | PRN

## 2016-09-03 DIAGNOSIS — I209 Angina pectoris, unspecified: Secondary | ICD-10-CM | POA: Diagnosis not present

## 2016-09-03 DIAGNOSIS — N400 Enlarged prostate without lower urinary tract symptoms: Secondary | ICD-10-CM | POA: Diagnosis not present

## 2016-09-03 DIAGNOSIS — E119 Type 2 diabetes mellitus without complications: Secondary | ICD-10-CM | POA: Diagnosis not present

## 2016-09-03 DIAGNOSIS — I2510 Atherosclerotic heart disease of native coronary artery without angina pectoris: Secondary | ICD-10-CM | POA: Diagnosis not present

## 2016-09-03 DIAGNOSIS — R748 Abnormal levels of other serum enzymes: Secondary | ICD-10-CM | POA: Diagnosis not present

## 2016-09-03 DIAGNOSIS — D692 Other nonthrombocytopenic purpura: Secondary | ICD-10-CM | POA: Diagnosis not present

## 2016-09-03 DIAGNOSIS — F1120 Opioid dependence, uncomplicated: Secondary | ICD-10-CM | POA: Diagnosis not present

## 2016-09-03 DIAGNOSIS — I252 Old myocardial infarction: Secondary | ICD-10-CM | POA: Diagnosis not present

## 2016-09-03 DIAGNOSIS — R809 Proteinuria, unspecified: Secondary | ICD-10-CM | POA: Diagnosis not present

## 2016-10-06 ENCOUNTER — Other Ambulatory Visit: Payer: Self-pay | Admitting: *Deleted

## 2016-10-06 DIAGNOSIS — D66 Hereditary factor VIII deficiency: Secondary | ICD-10-CM

## 2016-10-06 DIAGNOSIS — G8929 Other chronic pain: Secondary | ICD-10-CM

## 2016-10-06 MED ORDER — MORPHINE SULFATE 30 MG PO TABS: 30.0000 mg | ORAL_TABLET | ORAL | 0 refills | Status: DC | PRN

## 2016-11-03 DIAGNOSIS — I208 Other forms of angina pectoris: Secondary | ICD-10-CM | POA: Diagnosis not present

## 2016-11-03 DIAGNOSIS — D689 Coagulation defect, unspecified: Secondary | ICD-10-CM | POA: Diagnosis not present

## 2016-11-03 DIAGNOSIS — I2510 Atherosclerotic heart disease of native coronary artery without angina pectoris: Secondary | ICD-10-CM | POA: Diagnosis not present

## 2016-11-03 DIAGNOSIS — I25118 Atherosclerotic heart disease of native coronary artery with other forms of angina pectoris: Secondary | ICD-10-CM | POA: Diagnosis not present

## 2016-11-26 ENCOUNTER — Telehealth: Payer: Self-pay | Admitting: *Deleted

## 2016-11-26 ENCOUNTER — Other Ambulatory Visit: Payer: Self-pay | Admitting: *Deleted

## 2016-11-26 DIAGNOSIS — G8929 Other chronic pain: Secondary | ICD-10-CM

## 2016-11-26 DIAGNOSIS — D66 Hereditary factor VIII deficiency: Secondary | ICD-10-CM

## 2016-11-26 MED ORDER — MORPHINE SULFATE 30 MG PO TABS: 30.0000 mg | ORAL_TABLET | ORAL | 0 refills | Status: DC | PRN

## 2016-11-26 NOTE — Telephone Encounter (Signed)
Patient called requesting refill of his morphine. Patient would like to pick this up tomorrow.

## 2017-01-21 ENCOUNTER — Other Ambulatory Visit: Payer: Self-pay | Admitting: *Deleted

## 2017-01-21 DIAGNOSIS — D66 Hereditary factor VIII deficiency: Secondary | ICD-10-CM

## 2017-01-21 DIAGNOSIS — G8929 Other chronic pain: Secondary | ICD-10-CM

## 2017-01-21 MED ORDER — MORPHINE SULFATE 30 MG PO TABS
30.0000 mg | ORAL_TABLET | ORAL | 0 refills | Status: DC | PRN
Start: 2017-01-21 — End: 2017-01-21

## 2017-01-21 MED ORDER — MORPHINE SULFATE 30 MG PO TABS: 30.0000 mg | ORAL_TABLET | ORAL | 0 refills | Status: DC | PRN

## 2017-02-01 DIAGNOSIS — Z125 Encounter for screening for malignant neoplasm of prostate: Secondary | ICD-10-CM | POA: Diagnosis not present

## 2017-02-01 DIAGNOSIS — E119 Type 2 diabetes mellitus without complications: Secondary | ICD-10-CM | POA: Diagnosis not present

## 2017-02-01 DIAGNOSIS — I10 Essential (primary) hypertension: Secondary | ICD-10-CM | POA: Diagnosis not present

## 2017-02-05 DIAGNOSIS — I252 Old myocardial infarction: Secondary | ICD-10-CM | POA: Diagnosis not present

## 2017-02-05 DIAGNOSIS — D66 Hereditary factor VIII deficiency: Secondary | ICD-10-CM | POA: Diagnosis not present

## 2017-02-05 DIAGNOSIS — N400 Enlarged prostate without lower urinary tract symptoms: Secondary | ICD-10-CM | POA: Diagnosis not present

## 2017-02-05 DIAGNOSIS — I119 Hypertensive heart disease without heart failure: Secondary | ICD-10-CM | POA: Diagnosis not present

## 2017-02-05 DIAGNOSIS — I208 Other forms of angina pectoris: Secondary | ICD-10-CM | POA: Diagnosis not present

## 2017-02-05 DIAGNOSIS — E1169 Type 2 diabetes mellitus with other specified complication: Secondary | ICD-10-CM | POA: Diagnosis not present

## 2017-02-05 DIAGNOSIS — I2510 Atherosclerotic heart disease of native coronary artery without angina pectoris: Secondary | ICD-10-CM | POA: Diagnosis not present

## 2017-02-05 DIAGNOSIS — Z0000 Encounter for general adult medical examination without abnormal findings: Secondary | ICD-10-CM | POA: Diagnosis not present

## 2017-02-05 DIAGNOSIS — R748 Abnormal levels of other serum enzymes: Secondary | ICD-10-CM | POA: Diagnosis not present

## 2017-03-29 ENCOUNTER — Telehealth: Payer: Self-pay

## 2017-03-29 DIAGNOSIS — G8929 Other chronic pain: Secondary | ICD-10-CM

## 2017-03-29 DIAGNOSIS — D66 Hereditary factor VIII deficiency: Secondary | ICD-10-CM

## 2017-03-29 NOTE — Telephone Encounter (Signed)
Pt called for morphine refill. He is out. Pt has not been seen in 2 years. Will ask Dr Lindi Adie about the prescription and make an appt for pt to see Dr Jana Hakim. (Dr Jana Hakim not in office until Friday).  In basket sent for appt "soon"

## 2017-03-30 ENCOUNTER — Other Ambulatory Visit: Payer: Self-pay | Admitting: *Deleted

## 2017-03-30 DIAGNOSIS — G8929 Other chronic pain: Secondary | ICD-10-CM

## 2017-03-30 DIAGNOSIS — D66 Hereditary factor VIII deficiency: Secondary | ICD-10-CM

## 2017-03-30 MED ORDER — MORPHINE SULFATE 30 MG PO TABS: 30.0000 mg | ORAL_TABLET | ORAL | 0 refills | Status: DC | PRN

## 2017-03-30 NOTE — Telephone Encounter (Signed)
This RN spoke with pt - note prior appointment for FTKA were documented per pt's call with request for rescheduling that has not been completed.  Refill will be obtained for pt pick as well as follow up per appointment scheduling.

## 2017-04-01 ENCOUNTER — Telehealth: Payer: Self-pay | Admitting: Oncology

## 2017-04-01 NOTE — Telephone Encounter (Signed)
ERROR

## 2017-04-01 NOTE — Telephone Encounter (Signed)
sw pt to confirm 10/31 appt at 1415 per sch msg. Pt needed an afternoon appt. Gave pt next available afternoon per req

## 2017-04-28 ENCOUNTER — Ambulatory Visit: Payer: Self-pay | Admitting: Oncology

## 2017-04-28 ENCOUNTER — Other Ambulatory Visit: Payer: Self-pay

## 2017-05-27 ENCOUNTER — Other Ambulatory Visit: Payer: Self-pay

## 2017-05-27 DIAGNOSIS — D66 Hereditary factor VIII deficiency: Secondary | ICD-10-CM

## 2017-05-27 DIAGNOSIS — G8929 Other chronic pain: Secondary | ICD-10-CM

## 2017-05-27 MED ORDER — MORPHINE SULFATE 30 MG PO TABS: 30.0000 mg | ORAL_TABLET | ORAL | 0 refills | Status: DC | PRN

## 2017-05-27 NOTE — Telephone Encounter (Signed)
Pt requesting refill on MSIR pain medication. Told pt not due for a fill until 9/30th. Pt verbalized understanding. Will have script ready for pick tomorrow. Pt has appointment with Dr.Magrinat on 10/31 (wed). Reminded pt of this appt and to confirm this time/date.

## 2017-06-23 DIAGNOSIS — Z23 Encounter for immunization: Secondary | ICD-10-CM | POA: Diagnosis not present

## 2017-06-27 NOTE — Progress Notes (Signed)
ID: [name]   DOB: 03-30-1931  MR#: [id number]  CSN#:[id number]   PCP: Joshua Pao, MD GYN: SU:  OTHER MD: [name], [name]  INTERVAL HISTORY: [name] returns today for follow-up and treatment of his hemophilia A. He has had some minor bleeding issues that are of no significant change to his prior bleeding episodes. He is currently taking Atenolol and Lipitor. His PCP is Dr. Domenick Serrano.   Of course he also has chronic pain secondary to his multiple joint contractures.  For this he generally uses 30 mg of short-acting morphine every 4 hours if needed for pain. He sometimes has to take 2 pills to manage his pain, but this is a rare occurrence.   REVIEW OF SYSTEMS: [name] reports feeling "the same" overall. 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 He occasionally has some drowsiness. He reports constipation, but this is managed with miralax. He takes the miralax rarely, with his last dose being 6 months ago. He has a regular bowel movement most days, but sometimes will have hard stools. He has occasional headaches. He denies unusual headaches, visual changes, nausea, vomiting, or dizziness. There has been no unusual cough, phlegm production, or pleurisy. This been no change in bowel or bladder habits. He denies unexplained fatigue or unexplained weight loss, bleeding, rash, or fever. A detailed review of systems was otherwise stable.    PAST MEDICAL HISTORY: Significant for hemophilia A., moderate; multiple hemarthroses, with chronic pain syndrome; history of cataracts, GERD, borderline glucose intolerance, and possible remote NSTEMI  FAMILY HISTORY His father died at the age of 4 with prostate cancer. His mother died at the age of 75. He has 3 brothers and one sister. There is no history of hemophilia or other cancer in the family.  SOCIAL HISTORY: He used to work in a loading dock. His wife  of 49 years, [name], used to work in [place]. The patient has 5 children, one in [place] one in [place] one in [place] one in [place] and one in [place]. He has more than 20 grandchildren. He attends a [employer].  ADVANCED DIRECTIVES: Not in place  HEALTH MAINTENANCE: Social History  Substance Use Topics  . Smoking status: Not on file  . Smokeless tobacco: Not on file  . Alcohol use Not on file    Allergies  Allergen Reactions  . Aspirin Other (See Comments)    Increased bleeding per hemchromatosis  . Ibuprofen Other (See Comments)    Bleeding per hemechromatosis  . Prednisone Other (See Comments)    Bleeding per hemechromatosis    Current Outpatient Prescriptions  Medication Sig Dispense Refill  . atenolol (TENORMIN) 50 MG tablet Take 50 mg by mouth 2 (two) times daily.    Marland Kitchen atorvastatin (LIPITOR) 20 MG tablet Take 20 mg by mouth daily.    Marland Kitchen morphine (MSIR) 30 MG tablet Take 1 tablet (30 mg total) by mouth every 4 (four) hours as needed. 180 tablet 0  . nitroGLYCERIN (NITROSTAT) 0.4 MG SL tablet Place 0.4 mg under the tongue every 5 (five) minutes as needed for chest pain.    . polyethylene glycol (MIRALAX / GLYCOLAX) packet Take 17 g by mouth daily as needed.     No current facility-administered medications for this visit.     OBJECTIVE:  Elderly white male who appears stated age  30:   06/30/17  1414  BP: (!) 155/60  Pulse: 68  Resp: 17  Temp: 98 F (36.7 C)  SpO2: 99%     Body mass index is 20.72 kg/m.    ECOG FS: 2  Sclerae unicteric, pupils round and equal Oropharynx clear and moist No cervical or supraclavicular adenopathy Lungs no rales or rhonchi Heart regular rate and rhythm Abd soft, nontender, positive bowel sounds MSK multiple joint contractures and kyphosis and scoliosis, but no focal spinal tenderness Neuro: nonfocal, well oriented, appropriate affect    LAB RESULTS: Lab Results  Component Value Date   WBC 4.8  06/30/2017   NEUTROABS 3.0 06/30/2017   HGB 10.0 (L) 06/30/2017   HCT 31.1 (L) 06/30/2017   MCV 85.8 06/30/2017   PLT 121 (L) 06/30/2017      Chemistry   No results found for: NA No results found for: CALCIUM     No results found for: LABCA2  No components found for: [id number]  No results for input(s): INR in the last 168 hours.  Urinalysis No results found for: COLORURINE  STUDIES: No results found.   ASSESSMENT:  83 y.o. [name] man with a history of hemophilia A and a chronic pain syndrome secondary to multiple prior fractures and hemarthroses.   PLAN:  [name] is doing remarkably well despite his history of hemophilia A.  He has not had a major bleeding episode in quite some time.  He maintains a near normal degree of activity considering his age.  Today we discussed pain management again.  He is very stable on his use of MSIR, which he titrates to the pain he is feeling on a particular day.  There has been no significant increase over time and his use of narcotics.  There is no evidence of diversion.  The one concern is constipation.  He is on MiraLAX which he takes irregularly.  I suggested he use stool softeners more frequently to make sure he does not have hard bowel movements which themselves can cause bleeding and other complications  Otherwise we will continue the pain medicine as before and he will return to see me in 1 year  He knows to call for any other issues that may develop before then.  [name], Joshua Dad, MD  06/30/17 2:40 PM Medical Oncology and Hematology [facility] [address] [place], [place] [address] Tel. [phone]    Fax. [phone]  This document serves as a record of services personally performed by Joshua Del, MD. It was created on his behalf by [name], a trained medical scribe. The creation of this record is based on the scribe's personal observations and the provider's statements to them. This document  has been checked and approved by the attending provider.

## 2017-06-29 ENCOUNTER — Other Ambulatory Visit: Payer: Self-pay | Admitting: *Deleted

## 2017-06-29 DIAGNOSIS — D66 Hereditary factor VIII deficiency: Secondary | ICD-10-CM

## 2017-06-30 ENCOUNTER — Other Ambulatory Visit (HOSPITAL_BASED_OUTPATIENT_CLINIC_OR_DEPARTMENT_OTHER): Payer: Commercial Managed Care - HMO

## 2017-06-30 ENCOUNTER — Ambulatory Visit (HOSPITAL_BASED_OUTPATIENT_CLINIC_OR_DEPARTMENT_OTHER): Payer: Commercial Managed Care - HMO | Admitting: Oncology

## 2017-06-30 VITALS — BP 155/60 | HR 68 | Temp 98.0°F | Resp 17 | Ht 68.0 in | Wt 136.3 lb

## 2017-06-30 DIAGNOSIS — D66 Hereditary factor VIII deficiency: Secondary | ICD-10-CM

## 2017-06-30 DIAGNOSIS — G894 Chronic pain syndrome: Secondary | ICD-10-CM | POA: Diagnosis not present

## 2017-06-30 LAB — CBC WITH DIFFERENTIAL/PLATELET
BASO%: 0.5 % (ref 0.0–2.0)
Basophils Absolute: 0 10*3/uL (ref 0.0–0.1)
EOS%: 0.7 % (ref 0.0–7.0)
Eosinophils Absolute: 0 10*3/uL (ref 0.0–0.5)
HCT: 31.1 % — ABNORMAL LOW (ref 38.4–49.9)
HGB: 10 g/dL — ABNORMAL LOW (ref 13.0–17.1)
LYMPH%: 26.9 % (ref 14.0–49.0)
MCH: 27.5 pg (ref 27.2–33.4)
MCHC: 32.1 g/dL (ref 32.0–36.0)
MCV: 85.8 fL (ref 79.3–98.0)
MONO#: 0.4 10*3/uL (ref 0.1–0.9)
MONO%: 8.6 % (ref 0.0–14.0)
NEUT#: 3 10*3/uL (ref 1.5–6.5)
NEUT%: 63.3 % (ref 39.0–75.0)
Platelets: 121 10*3/uL — ABNORMAL LOW (ref 140–400)
RBC: 3.62 10*6/uL — ABNORMAL LOW (ref 4.20–5.82)
RDW: 16.8 % — ABNORMAL HIGH (ref 11.0–14.6)
WBC: 4.8 10*3/uL (ref 4.0–10.3)
lymph#: 1.3 10*3/uL (ref 0.9–3.3)

## 2017-07-02 ENCOUNTER — Telehealth: Payer: Self-pay | Admitting: Oncology

## 2017-07-02 NOTE — Telephone Encounter (Signed)
Scheduled appt per 10/31 los - lab and f/u in one year - reminder letter sent in the mail.

## 2017-08-03 ENCOUNTER — Other Ambulatory Visit: Payer: Self-pay

## 2017-08-03 ENCOUNTER — Telehealth: Payer: Self-pay

## 2017-08-03 DIAGNOSIS — G8929 Other chronic pain: Secondary | ICD-10-CM

## 2017-08-03 DIAGNOSIS — D66 Hereditary factor VIII deficiency: Secondary | ICD-10-CM

## 2017-08-03 MED ORDER — MORPHINE SULFATE 30 MG PO TABS: 30.0000 mg | ORAL_TABLET | ORAL | 0 refills | Status: DC | PRN

## 2017-08-03 NOTE — Telephone Encounter (Signed)
New prescription for MSIR has been printed and signed.  Pt called and made aware that he can pick prescription up today.  Prescription taken to triage and placed in binder.

## 2017-08-06 DIAGNOSIS — D66 Hereditary factor VIII deficiency: Secondary | ICD-10-CM | POA: Diagnosis not present

## 2017-08-06 DIAGNOSIS — I2510 Atherosclerotic heart disease of native coronary artery without angina pectoris: Secondary | ICD-10-CM | POA: Diagnosis not present

## 2017-08-06 DIAGNOSIS — M1990 Unspecified osteoarthritis, unspecified site: Secondary | ICD-10-CM | POA: Diagnosis not present

## 2017-08-06 DIAGNOSIS — R748 Abnormal levels of other serum enzymes: Secondary | ICD-10-CM | POA: Diagnosis not present

## 2017-08-06 DIAGNOSIS — F1120 Opioid dependence, uncomplicated: Secondary | ICD-10-CM | POA: Diagnosis not present

## 2017-08-06 DIAGNOSIS — I10 Essential (primary) hypertension: Secondary | ICD-10-CM | POA: Diagnosis not present

## 2017-08-06 DIAGNOSIS — E119 Type 2 diabetes mellitus without complications: Secondary | ICD-10-CM | POA: Diagnosis not present

## 2017-08-06 DIAGNOSIS — I119 Hypertensive heart disease without heart failure: Secondary | ICD-10-CM | POA: Diagnosis not present

## 2017-08-06 DIAGNOSIS — N400 Enlarged prostate without lower urinary tract symptoms: Secondary | ICD-10-CM | POA: Diagnosis not present

## 2017-09-15 DIAGNOSIS — Z885 Allergy status to narcotic agent status: Secondary | ICD-10-CM | POA: Diagnosis not present

## 2017-09-15 DIAGNOSIS — B182 Chronic viral hepatitis C: Secondary | ICD-10-CM | POA: Diagnosis not present

## 2017-09-15 DIAGNOSIS — D66 Hereditary factor VIII deficiency: Secondary | ICD-10-CM | POA: Diagnosis not present

## 2017-09-15 DIAGNOSIS — G8929 Other chronic pain: Secondary | ICD-10-CM | POA: Diagnosis not present

## 2017-09-15 DIAGNOSIS — Z96652 Presence of left artificial knee joint: Secondary | ICD-10-CM | POA: Diagnosis not present

## 2017-09-15 DIAGNOSIS — D68318 Other hemorrhagic disorder due to intrinsic circulating anticoagulants, antibodies, or inhibitors: Secondary | ICD-10-CM | POA: Diagnosis not present

## 2017-09-15 DIAGNOSIS — M25521 Pain in right elbow: Secondary | ICD-10-CM | POA: Diagnosis not present

## 2017-09-15 DIAGNOSIS — R079 Chest pain, unspecified: Secondary | ICD-10-CM | POA: Diagnosis not present

## 2017-09-15 DIAGNOSIS — M362 Hemophilic arthropathy: Secondary | ICD-10-CM | POA: Diagnosis not present

## 2017-09-15 DIAGNOSIS — R634 Abnormal weight loss: Secondary | ICD-10-CM | POA: Diagnosis not present

## 2017-09-15 DIAGNOSIS — S59901A Unspecified injury of right elbow, initial encounter: Secondary | ICD-10-CM | POA: Diagnosis not present

## 2017-09-15 DIAGNOSIS — I2510 Atherosclerotic heart disease of native coronary artery without angina pectoris: Secondary | ICD-10-CM | POA: Diagnosis not present

## 2017-09-15 DIAGNOSIS — M62549 Muscle wasting and atrophy, not elsewhere classified, unspecified hand: Secondary | ICD-10-CM | POA: Diagnosis not present

## 2017-10-06 DIAGNOSIS — M65831 Other synovitis and tenosynovitis, right forearm: Secondary | ICD-10-CM | POA: Diagnosis not present

## 2017-10-06 DIAGNOSIS — R600 Localized edema: Secondary | ICD-10-CM | POA: Diagnosis not present

## 2017-10-06 DIAGNOSIS — D66 Hereditary factor VIII deficiency: Secondary | ICD-10-CM | POA: Diagnosis not present

## 2017-10-06 DIAGNOSIS — M25421 Effusion, right elbow: Secondary | ICD-10-CM | POA: Diagnosis not present

## 2017-10-06 DIAGNOSIS — M62531 Muscle wasting and atrophy, not elsewhere classified, right forearm: Secondary | ICD-10-CM | POA: Diagnosis not present

## 2017-10-06 DIAGNOSIS — S53441A Ulnar collateral ligament sprain of right elbow, initial encounter: Secondary | ICD-10-CM | POA: Diagnosis not present

## 2017-10-06 DIAGNOSIS — M65821 Other synovitis and tenosynovitis, right upper arm: Secondary | ICD-10-CM | POA: Diagnosis not present

## 2017-10-06 DIAGNOSIS — M362 Hemophilic arthropathy: Secondary | ICD-10-CM | POA: Diagnosis not present

## 2017-10-06 DIAGNOSIS — M778 Other enthesopathies, not elsewhere classified: Secondary | ICD-10-CM | POA: Diagnosis not present

## 2017-10-26 ENCOUNTER — Other Ambulatory Visit: Payer: Self-pay

## 2017-10-26 DIAGNOSIS — D66 Hereditary factor VIII deficiency: Secondary | ICD-10-CM

## 2017-10-26 DIAGNOSIS — G8929 Other chronic pain: Secondary | ICD-10-CM

## 2017-10-26 MED ORDER — MORPHINE SULFATE 30 MG PO TABS: 30.0000 mg | ORAL_TABLET | ORAL | 0 refills | Status: AC | PRN

## 2017-10-26 NOTE — Telephone Encounter (Signed)
Pt called to request a new refill for his MSIR. Pt states that he will be coming by tomorrow to pick it up in the morning. Told pt that it will be ready for pick up by then. No further needs at this time.

## 2017-10-28 DIAGNOSIS — R202 Paresthesia of skin: Secondary | ICD-10-CM | POA: Diagnosis not present

## 2017-11-09 DIAGNOSIS — D689 Coagulation defect, unspecified: Secondary | ICD-10-CM | POA: Diagnosis not present

## 2017-11-09 DIAGNOSIS — E785 Hyperlipidemia, unspecified: Secondary | ICD-10-CM | POA: Diagnosis not present

## 2017-11-09 DIAGNOSIS — E119 Type 2 diabetes mellitus without complications: Secondary | ICD-10-CM | POA: Diagnosis not present

## 2017-11-09 DIAGNOSIS — I119 Hypertensive heart disease without heart failure: Secondary | ICD-10-CM | POA: Diagnosis not present

## 2017-11-09 DIAGNOSIS — I208 Other forms of angina pectoris: Secondary | ICD-10-CM | POA: Diagnosis not present

## 2017-11-09 DIAGNOSIS — I25118 Atherosclerotic heart disease of native coronary artery with other forms of angina pectoris: Secondary | ICD-10-CM | POA: Diagnosis not present

## 2017-11-11 ENCOUNTER — Emergency Department (HOSPITAL_COMMUNITY): Payer: Medicare HMO

## 2017-11-11 ENCOUNTER — Other Ambulatory Visit: Payer: Self-pay

## 2017-11-11 ENCOUNTER — Encounter (HOSPITAL_COMMUNITY): Payer: Self-pay

## 2017-11-11 ENCOUNTER — Emergency Department (HOSPITAL_COMMUNITY)
Admission: EM | Admit: 2017-11-11 | Discharge: 2017-11-11 | Disposition: A | Discharge status: Home / Self Care | Payer: Medicare HMO | Attending: Emergency Medicine | Admitting: Emergency Medicine

## 2017-11-11 DIAGNOSIS — K529 Noninfective gastroenteritis and colitis, unspecified: Secondary | ICD-10-CM | POA: Diagnosis not present

## 2017-11-11 DIAGNOSIS — Z79899 Other long term (current) drug therapy: Secondary | ICD-10-CM | POA: Diagnosis not present

## 2017-11-11 DIAGNOSIS — R195 Other fecal abnormalities: Secondary | ICD-10-CM | POA: Diagnosis present

## 2017-11-11 DIAGNOSIS — K6389 Other specified diseases of intestine: Secondary | ICD-10-CM | POA: Insufficient documentation

## 2017-11-11 DIAGNOSIS — R109 Unspecified abdominal pain: Secondary | ICD-10-CM | POA: Diagnosis not present

## 2017-11-11 DIAGNOSIS — I10 Essential (primary) hypertension: Secondary | ICD-10-CM | POA: Diagnosis not present

## 2017-11-11 DIAGNOSIS — Z1401 Asymptomatic hemophilia A carrier: Secondary | ICD-10-CM | POA: Insufficient documentation

## 2017-11-11 DIAGNOSIS — C183 Malignant neoplasm of hepatic flexure: Secondary | ICD-10-CM | POA: Diagnosis not present

## 2017-11-11 DIAGNOSIS — R1032 Left lower quadrant pain: Secondary | ICD-10-CM | POA: Diagnosis not present

## 2017-11-11 HISTORY — DX: Hereditary factor VIII deficiency: D66

## 2017-11-11 HISTORY — DX: Essential (primary) hypertension: I10

## 2017-11-11 LAB — POC OCCULT BLOOD, ED: Fecal Occult Bld: NEGATIVE

## 2017-11-11 LAB — COMPREHENSIVE METABOLIC PANEL
ALBUMIN: 3.3 g/dL — AB (ref 3.5–5.0)
ALK PHOS: 55 U/L (ref 38–126)
ALT: 40 U/L (ref 17–63)
ANION GAP: 11 (ref 5–15)
AST: 74 U/L — ABNORMAL HIGH (ref 15–41)
BUN: 23 mg/dL — ABNORMAL HIGH (ref 6–20)
CO2: 19 mmol/L — ABNORMAL LOW (ref 22–32)
Calcium: 8.7 mg/dL — ABNORMAL LOW (ref 8.9–10.3)
Chloride: 110 mmol/L (ref 101–111)
Creatinine, Ser: 1.09 mg/dL (ref 0.61–1.24)
GFR calc Af Amer: >60 mL/min (ref >60)
GFR calc non Af Amer: >60 mL/min (ref >60)
GLUCOSE: 170 mg/dL — AB (ref 65–99)
POTASSIUM: 3.7 mmol/L (ref 3.5–5.1)
Sodium: 140 mmol/L (ref 135–145)
Total Bilirubin: 1.9 mg/dL — ABNORMAL HIGH (ref 0.3–1.2)
Total Protein: 7.5 g/dL (ref 6.5–8.1)

## 2017-11-11 LAB — URINALYSIS, ROUTINE W REFLEX MICROSCOPIC
Bilirubin Urine: NEGATIVE
Glucose, UA: NEGATIVE mg/dL
Hgb urine dipstick: NEGATIVE
Ketones, ur: NEGATIVE mg/dL
LEUKOCYTES UA: NEGATIVE
Nitrite: NEGATIVE
PROTEIN: NEGATIVE mg/dL
Specific Gravity, Urine: 1.034 — ABNORMAL HIGH (ref 1.005–1.030)
pH: 5 (ref 5.0–8.0)

## 2017-11-11 LAB — CBC
HEMATOCRIT: 29.4 % — AB (ref 39.0–52.0)
HEMOGLOBIN: 9.5 g/dL — AB (ref 13.0–17.0)
MCH: 27.2 pg (ref 26.0–34.0)
MCHC: 32.3 g/dL (ref 30.0–36.0)
MCV: 84.2 fL (ref 78.0–100.0)
Platelets: 113 10*3/uL — ABNORMAL LOW (ref 150–400)
RBC: 3.49 MIL/uL — ABNORMAL LOW (ref 4.22–5.81)
RDW: 17.7 % — ABNORMAL HIGH (ref 11.5–15.5)
WBC: 4.6 10*3/uL (ref 4.0–10.5)

## 2017-11-11 LAB — TYPE AND SCREEN
ABO/RH(D): A POS
Antibody Screen: NEGATIVE

## 2017-11-11 LAB — PROTIME-INR
INR: 1.25
Prothrombin Time: 15.6 seconds — ABNORMAL HIGH (ref 11.4–15.2)

## 2017-11-11 LAB — ABO/RH: ABO/RH(D): A POS

## 2017-11-11 LAB — APTT: APTT: 62 s — AB (ref 24–36)

## 2017-11-11 MED ORDER — IOPAMIDOL (ISOVUE-300) INJECTION 61%
INTRAVENOUS | Status: AC
  Administered 2017-11-11: 100 mL
  Filled 2017-11-11: qty 100

## 2017-11-11 MED ORDER — CIPROFLOXACIN HCL 500 MG PO TABS
500.0000 mg | ORAL_TABLET | Freq: Once | ORAL | Status: AC
  Administered 2017-11-11: 500 mg via ORAL
  Filled 2017-11-11: qty 1

## 2017-11-11 MED ORDER — CIPROFLOXACIN HCL 500 MG PO TABS: 500.0000 mg | ORAL_TABLET | Freq: Two times a day (BID) | ORAL | 0 refills | Status: AC

## 2017-11-11 MED ORDER — METRONIDAZOLE 500 MG PO TABS
500.0000 mg | ORAL_TABLET | Freq: Once | ORAL | Status: AC
  Administered 2017-11-11: 500 mg via ORAL
  Filled 2017-11-11: qty 1

## 2017-11-11 MED ORDER — METRONIDAZOLE 500 MG PO TABS: 500.0000 mg | ORAL_TABLET | Freq: Three times a day (TID) | ORAL | 0 refills | Status: AC

## 2017-11-11 NOTE — ED Triage Notes (Signed)
Pt presents for evaluation of rectal bleeding. Pt reports this is an ongoing issue and has hx of hemophilia. Pt is pale but denies cp/sob. O2 sats 97%. Pt alert and oriented x 4.

## 2017-11-11 NOTE — ED Notes (Signed)
Pt made aware he needs to provide a UA for [place]. Pt verbalized understanding.

## 2017-11-11 NOTE — ED Provider Notes (Signed)
[facility] EMERGENCY DEPARTMENT Provider Note   CSN: [id number] Arrival date & time: 11/11/17  1327     History   Chief Complaint Chief Complaint  Patient presents with  . Rectal Bleeding    HPI [name] is a 84 y.o. male.  HPI [name] is a 84 y.o. male with history of hemophilia A, hypertension, hepatitis, presents to emergency department with complaint of black stools.  Patient states that he noticed he had some abdominal pain 2 days ago.  He states that he noticed that his bowel movement was black on Tuesday, which was 2 days ago.  He states since then he had one more bowel movement that was abnormal as well.  He denies any diarrhea.  He denies any bright red blood.  He states he is continued to have some left lower abdominal pain but states it is mild.  No nausea or vomiting.  No dizziness or lightheadedness.  He reports history of black stools in the past but states he never seeked care.  Denies being hospitalized for GI bleed in the past.  States he has never had a colonoscopy or endoscopy.  Past Medical History:  Diagnosis Date  . Hemophilia A ([place])   . Hypertension     Patient Active Problem List   Diagnosis Date Noted  . Chronic pain 04/18/2015  . Angina pectoris ([place])   . Hyperlipidemia   . Hemophilia A ([place]) 02/01/2012    History reviewed. No pertinent surgical history.     Home Medications    Prior to Admission medications   Medication Sig Start Date End Date Taking? Authorizing Provider  atenolol (TENORMIN) 50 MG tablet Take 50 mg by mouth 2 (two) times daily.    [provider]  atorvastatin (LIPITOR) 20 MG tablet Take 20 mg by mouth daily.    [provider]  morphine (MSIR) 30 MG tablet Take 1 tablet (30 mg total) by mouth every 4 (four) hours as needed. 10/26/17   Magrinat, Virgie Dad, MD  nitroGLYCERIN (NITROSTAT) 0.4 MG SL tablet Place 0.4 mg under the tongue every 5 (five) minutes as needed for  chest pain.    [provider]  polyethylene glycol (MIRALAX / GLYCOLAX) packet Take 17 g by mouth daily as needed.    [provider]    Family History No family history on file.  Social History Social History   Tobacco Use  . Smoking status: Not on file  Substance Use Topics  . Alcohol use: Not on file  . Drug use: Not on file     Allergies   Aspirin; Ibuprofen; and Prednisone   Review of Systems Review of Systems  Constitutional: Negative for chills and fever.  Respiratory: Negative for cough, chest tightness and shortness of breath.   Cardiovascular: Negative for chest pain, palpitations and leg swelling.  Gastrointestinal: Positive for abdominal pain and blood in stool. Negative for abdominal distention, diarrhea, nausea and vomiting.  Genitourinary: Negative for dysuria, frequency, hematuria and urgency.  Musculoskeletal: Negative for arthralgias, myalgias, neck pain and neck stiffness.  Skin: Negative for rash.  Allergic/Immunologic: Negative for immunocompromised state.  Neurological: Negative for dizziness, weakness, light-headedness, numbness and headaches.  All other systems reviewed and are negative.    Physical Exam Updated Vital Signs BP 132/65 (BP Location: Left Arm)   Pulse (!) 52   Temp 98.2 F (36.8 C) (Oral)   Resp 18   SpO2 100%   Physical Exam  Constitutional:  He appears well-developed and well-nourished. No distress.  HENT:  Head: Normocephalic and atraumatic.  Eyes: Conjunctivae are normal.  Neck: Neck supple.  Cardiovascular: Normal rate, regular rhythm and normal heart sounds.  Pulmonary/Chest: Effort normal. No respiratory distress. He has no wheezes. He has no rales.  Abdominal: Soft. Bowel sounds are normal. He exhibits no distension. There is tenderness. There is no rebound.  LLQ tenderness. No guarding. No rebound tenderness  Genitourinary: Rectum normal.  Genitourinary Comments: Stool brown  Musculoskeletal: He  exhibits no edema.  Neurological: He is alert.  Skin: Skin is warm and dry.  Nursing note and vitals reviewed.    ED Treatments / Results  Labs (all labs ordered are listed, but only abnormal results are displayed) Labs Reviewed  COMPREHENSIVE METABOLIC PANEL - Abnormal; Notable for the following components:      Result Value   CO2 19 (*)    Glucose, Bld 170 (*)    BUN 23 (*)    Calcium 8.7 (*)    Albumin 3.3 (*)    AST 74 (*)    Total Bilirubin 1.9 (*)    All other components within normal limits  CBC - Abnormal; Notable for the following components:   RBC 3.49 (*)    Hemoglobin 9.5 (*)    HCT 29.4 (*)    RDW 17.7 (*)    Platelets 113 (*)    All other components within normal limits  URINALYSIS, ROUTINE W REFLEX MICROSCOPIC - Abnormal; Notable for the following components:   Specific Gravity, Urine 1.034 (*)    All other components within normal limits  PROTIME-INR - Abnormal; Notable for the following components:   Prothrombin Time 15.6 (*)    All other components within normal limits  APTT - Abnormal; Notable for the following components:   aPTT 62 (*)    All other components within normal limits  FACTOR 8 ASSAY  POC OCCULT BLOOD, ED  TYPE AND SCREEN  ABO/RH    EKG  EKG Interpretation None       Radiology Ct Abdomen Pelvis W Contrast  Result Date: 11/11/2017 CLINICAL DATA:  Abdominal pain and rectal bleeding. Diverticulitis suspected. Patient has hemophilia. EXAM: CT ABDOMEN AND PELVIS WITH CONTRAST TECHNIQUE: Multidetector CT imaging of the abdomen and pelvis was performed using the standard protocol following bolus administration of intravenous contrast. CONTRAST:  157mL ISOVUE-300 IOPAMIDOL (ISOVUE-300) INJECTION 61% COMPARISON:  06/23/2004 FINDINGS: Lower chest: Mild cardiac enlargement without pericardial effusion. Small bilateral pleural effusions, right greater than left with adjacent atelectasis. Hepatobiliary: Cirrhotic appearing liver with  heterogeneous rounded 7 x 6.5 x 6 cm mixed attenuating mass in the left hepatic lobe with areas of hypodensity centrally as well as along the periphery concerning for hepatocellular carcinoma in the setting of cirrhosis. No biliary dilatation. Physiologic distention of the gallbladder. Pancreas: Atrophic pancreas without ductal dilatation, mass or inflammation. Spleen: Mild splenic enlargement with the spleen measuring 13.5 x 10.7 x 5 cm (volume = 400 cm^3). No space-occupying mass. Adrenals/Urinary Tract: Adrenal glands are unremarkable. Kidneys are normal, without renal calculi, focal lesion, or hydronephrosis. Mild circumferential thickening of the urinary bladder suspicious for changes of cystitis. Stomach/Bowel: Decompressed stomach with normal small bowel rotation. No small bowel dilatation or obstruction. No inflammation. Normal terminal ileum. Average amount of fecal residue within the colon with scattered left-sided colonic diverticulosis. The descending colon through rectum is decompressed but there is also suggestion of mild diffuse transmural thickening of the colon. Possibility of a colitis is not  entirely excluded. Vascular/Lymphatic: Splenic and portal veins are patent. Moderate aortoiliac and branch vessel atherosclerosis without aneurysm. Reproductive: Prostate and seminal vesicles are within limits with peripheral zone calcifications prostate. Other: Small fat containing inguinal hernia on the right. No ascites. Musculoskeletal: Levoconvex curvature lumbar spine partial ankylosis from L2 through L4. Moderate-to-marked degenerative disc disease lumbar spine. No aggressive osseous lesions. IMPRESSION: 1. Heterogeneously enhancing left hepatic lobe mass measuring 7 x 6.5 x 6 cm in the setting of cirrhosis concerning for changes of hepatocellular carcinoma. Mild splenomegaly. 2. Cardiomegaly with small bilateral pleural effusions. 3. Decompressed left colon, somewhat diffusely thickened in appearance.  Cannot exclude the possibility of colitis given history of rectal bleeding. 4. Thick-walled urinary bladder likely representing changes of cystitis. 5. Small fat containing right inguinal hernia. 6. Levoconvex curvature of the lumbar spine with spondylosis. No aggressive osseous lesions. Electronically Signed   By: Ashley Royalty M.D.   On: 11/11/2017 19:32    Procedures Procedures (including critical care time)  Medications Ordered in ED Medications - No data to display   Initial Impression / Assessment and Plan / ED Course  I have reviewed the triage vital signs and the nursing notes.  Pertinent labs & imaging results that were available during my care of the patient were reviewed by me and considered in my medical decision making (see chart for details).     Patient with left lower quadrant abdominal pain that is mild, and with dark black stools.  Exam unremarkable other than left lower quadrant abdominal tenderness.  Hemoccult is negative.  Stool is brown.  Will get CT abdomen pelvis for further evaluation.  CT showing possible colitis, in setting of LLQ abdominal pain and possible bleeding will treat with antibiotics. Start cipro and flagyl. Also discussed finding of liver mass and need for furtehr outpatient follow up. Pt feels well here otherwise, has no other complaitns. VS have been normal. No signs of bleeding. I did speak with Dr. Lindi Adie who was on call for hem/onc and who advised to get factor 8 levels. He believed pt ok to be dc home and follow up closely tomorrow.   Prior to dc, just found out pt no longer sees Dr. [name] Agee, even though just saw him 4 months ago. Pt now goes to [place], offered to call [facility] but pt wanted to go home and did not want to wait any more. He will call [facility] in AM to inform them of his visit here and to see if he can followup. Close return precautions discussed in depth.   Vitals:   11/11/17 1915 11/11/17 2000 11/11/17 2115 11/11/17 2130   BP: (!) 151/75 (!) 154/68 (!) 155/71 (!) 145/69  Pulse: (!) 54 (!) 54 (!) 51 (!) 45  Resp: 12 11    Temp:      TempSrc:      SpO2: 100% 100% 100% 100%     Final Clinical Impressions(s) / ED Diagnoses   Final diagnoses:  Colitis  Hepatic flexure mass    ED Discharge Orders        Ordered    ciprofloxacin (CIPRO) 500 MG tablet  2 times daily     11/11/17 2140    metroNIDAZOLE (FLAGYL) 500 MG tablet  3 times daily     11/11/17 2140       Jeannett Senior, Hershal Coria 11/11/17 2202    Quintella Reichert, MD 11/16/17 [phone]

## 2017-11-11 NOTE — Discharge Instructions (Signed)
Your CT scan showed that you may have colitis.  Please take antibiotics as prescribed to treat this condition.  Please call your oncologist at [facility] tomorrow morning and inform them of your visit here.  If feeling worse, return back to emergency department.  Your CT scan additionally showed a mass on your liver, you will need follow-up with your family doctor or oncology for further evaluation of this lesion.

## 2017-11-11 NOTE — ED Notes (Addendum)
RN called from [facility]; reports page Coag/hemophilia fellow in [place] if any questions:  Barrie Lyme, MD

## 2017-11-12 ENCOUNTER — Telehealth: Payer: Self-pay | Admitting: *Deleted

## 2017-11-12 ENCOUNTER — Other Ambulatory Visit: Payer: Self-pay | Admitting: *Deleted

## 2017-11-12 DIAGNOSIS — K7460 Unspecified cirrhosis of liver: Secondary | ICD-10-CM

## 2017-11-12 DIAGNOSIS — G8929 Other chronic pain: Secondary | ICD-10-CM

## 2017-11-12 DIAGNOSIS — R791 Abnormal coagulation profile: Secondary | ICD-10-CM

## 2017-11-12 DIAGNOSIS — R160 Hepatomegaly, not elsewhere classified: Secondary | ICD-10-CM

## 2017-11-12 DIAGNOSIS — R748 Abnormal levels of other serum enzymes: Secondary | ICD-10-CM

## 2017-11-12 NOTE — Telephone Encounter (Signed)
This RN contacted pt to follow up on ER visit - discussed need to have additional lab work as well as MD visit to review results and any further concerns.  [name] verbalized understanding but did state " I will need a referral from Dr Loren Racer office to see you guys"  This RN post above conversation contacted Dr Loren Racer office and was transferred to the VM of " Claiborne Billings ".  This RN left a detailed request for referral including pt's name and DOB.  This RN's name and direct call back number given for communication.

## 2017-11-13 LAB — FACTOR 8 ASSAY: COAGULATION FACTOR VIII: 1 % — AB (ref 57–163)

## 2017-11-15 ENCOUNTER — Encounter: Payer: Self-pay | Admitting: Internal Medicine

## 2017-11-15 ENCOUNTER — Other Ambulatory Visit: Payer: Self-pay | Admitting: Oncology

## 2017-11-15 DIAGNOSIS — D66 Hereditary factor VIII deficiency: Secondary | ICD-10-CM

## 2017-11-15 DIAGNOSIS — I10 Essential (primary) hypertension: Secondary | ICD-10-CM | POA: Diagnosis not present

## 2017-11-15 DIAGNOSIS — K5792 Diverticulitis of intestine, part unspecified, without perforation or abscess without bleeding: Secondary | ICD-10-CM | POA: Diagnosis not present

## 2017-11-15 DIAGNOSIS — K7689 Other specified diseases of liver: Secondary | ICD-10-CM | POA: Diagnosis not present

## 2017-11-15 DIAGNOSIS — Z6823 Body mass index (BMI) 23.0-23.9, adult: Secondary | ICD-10-CM | POA: Diagnosis not present

## 2017-11-16 ENCOUNTER — Telehealth: Payer: Self-pay | Admitting: Internal Medicine

## 2017-11-16 ENCOUNTER — Inpatient Hospital Stay: Payer: Medicare HMO | Attending: Oncology

## 2017-11-16 DIAGNOSIS — R791 Abnormal coagulation profile: Secondary | ICD-10-CM | POA: Diagnosis not present

## 2017-11-16 DIAGNOSIS — R160 Hepatomegaly, not elsewhere classified: Secondary | ICD-10-CM

## 2017-11-16 DIAGNOSIS — K7460 Unspecified cirrhosis of liver: Secondary | ICD-10-CM

## 2017-11-16 DIAGNOSIS — D66 Hereditary factor VIII deficiency: Secondary | ICD-10-CM

## 2017-11-16 DIAGNOSIS — R748 Abnormal levels of other serum enzymes: Secondary | ICD-10-CM

## 2017-11-16 DIAGNOSIS — G8929 Other chronic pain: Secondary | ICD-10-CM

## 2017-11-16 LAB — COMPREHENSIVE METABOLIC PANEL
ALT: 29 U/L (ref 0–55)
AST: 55 U/L — ABNORMAL HIGH (ref 5–34)
Albumin: 3.1 g/dL — ABNORMAL LOW (ref 3.5–5.0)
Alkaline Phosphatase: 53 U/L (ref 40–150)
Anion gap: 9 (ref 3–11)
BILIRUBIN TOTAL: 1.4 mg/dL — AB (ref 0.2–1.2)
BUN: 20 mg/dL (ref 7–26)
CO2: 21 mmol/L — ABNORMAL LOW (ref 22–29)
CREATININE: 1.1 mg/dL (ref 0.70–1.30)
Calcium: 9 mg/dL (ref 8.4–10.4)
Chloride: 110 mmol/L — ABNORMAL HIGH (ref 98–109)
GFR, EST NON AFRICAN AMERICAN: 60 mL/min — AB (ref >60)
Glucose, Bld: 158 mg/dL — ABNORMAL HIGH (ref 70–140)
Potassium: 4.1 mmol/L (ref 3.5–5.1)
Sodium: 140 mmol/L (ref 136–145)
Total Protein: 7.3 g/dL (ref 6.4–8.3)

## 2017-11-16 LAB — CBC WITH DIFFERENTIAL/PLATELET
Basophils Absolute: 0 10*3/uL (ref 0.0–0.1)
Basophils Relative: 1 %
EOS ABS: 0.1 10*3/uL (ref 0.0–0.5)
EOS PCT: 1 %
HCT: 29.2 % — ABNORMAL LOW (ref 38.4–49.9)
HEMOGLOBIN: 9.3 g/dL — AB (ref 13.0–17.1)
Lymphocytes Relative: 23 %
Lymphs Abs: 1.1 10*3/uL (ref 0.9–3.3)
MCH: 26.8 pg — ABNORMAL LOW (ref 27.2–33.4)
MCHC: 31.8 g/dL — AB (ref 32.0–36.0)
MCV: 84.2 fL (ref 79.3–98.0)
Monocytes Absolute: 0.6 10*3/uL (ref 0.1–0.9)
Monocytes Relative: 12 %
NEUTROS PCT: 63 %
Neutro Abs: 3.1 10*3/uL (ref 1.5–6.5)
PLATELETS: 94 10*3/uL — AB (ref 140–400)
RBC: 3.47 MIL/uL — AB (ref 4.20–5.82)
RDW: 17.9 % — ABNORMAL HIGH (ref 11.0–14.6)
WBC: 4.9 10*3/uL (ref 4.0–10.3)

## 2017-11-16 NOTE — Telephone Encounter (Signed)
Patient is aware of appointment °

## 2017-11-16 NOTE — Telephone Encounter (Signed)
Scheduled pt to see Tye Savoy NP Friday 11/19/17@9am . Please notify pt of appt date and time.

## 2017-11-17 ENCOUNTER — Other Ambulatory Visit: Payer: Self-pay | Admitting: Oncology

## 2017-11-17 LAB — HEPATITIS PANEL, ACUTE
HCV Ab: >11 s/co ratio — ABNORMAL HIGH (ref 0.0–0.9)
HEP A IGM: NEGATIVE
Hep B C IgM: NEGATIVE
Hepatitis B Surface Ag: NEGATIVE

## 2017-11-17 LAB — AFP TUMOR MARKER: AFP, SERUM, TUMOR MARKER: 15.3 ng/mL — AB (ref 0.0–8.3)

## 2017-11-17 NOTE — Progress Notes (Signed)
I called [name] to discuss the findings of his CT scan obtained 11/11/2017.  They show cirrhosis and a 7 cm liver mass suspicious for hepatocellular carcinoma.  We set him up for lab work here which showed an AFP tumor marker level of 15.3.  We also obtained a hepatitis panel which showed hepatitis B surface antigen and hepatitis B core IgM negative, and hepatitis A negative, but hepatitis C antibody positive.  I discussed the case with [name] and told him he may have cancer of the liver and that he want to be treated here at [facility].  At this point he tells me he would want to be evaluated and treated here.  He actually has an appointment with [facility] on 11/19/2017.  I have discussed the case with our GI oncologist Dr. Burr Medico and she will schedule the patient to see her as appropriate  [name] is in agreement with this plan.

## 2017-11-18 ENCOUNTER — Other Ambulatory Visit: Payer: Self-pay | Admitting: Hematology

## 2017-11-18 DIAGNOSIS — T1590XA Foreign body on external eye, part unspecified, unspecified eye, initial encounter: Secondary | ICD-10-CM

## 2017-11-18 DIAGNOSIS — R160 Hepatomegaly, not elsewhere classified: Secondary | ICD-10-CM

## 2017-11-19 ENCOUNTER — Ambulatory Visit: Payer: Medicare HMO | Admitting: Nurse Practitioner

## 2017-11-19 ENCOUNTER — Encounter: Payer: Self-pay | Admitting: Nurse Practitioner

## 2017-11-19 VITALS — BP 112/64 | HR 80 | Wt 138.4 lb

## 2017-11-19 DIAGNOSIS — K7460 Unspecified cirrhosis of liver: Secondary | ICD-10-CM | POA: Diagnosis not present

## 2017-11-19 DIAGNOSIS — R160 Hepatomegaly, not elsewhere classified: Secondary | ICD-10-CM

## 2017-11-19 NOTE — Progress Notes (Addendum)
IMPRESSION and PLAN:    #76.  84 year old male with HCV cirrhosis and incidental finding of liver mass on CT scan.  Mass concerning for [facility] but did not meet radiologic criteria for diagnosis.  His AFP is just minimally elevated.  Patient needs -Await MRCP which has already been scheduled by PCP. -Depending on MRCP results patient may need liver biopsy for definitive diagnosis  #2.  GI bleeding, resolved.  Evaluated in ED 11/11/2017 for black stools with bright red blood.  In ED hemoglobin down about half a gram from baseline but interesting FOBT in ED was negative.  -For further evaluation of what sounds like recent gastrointestinal bleeding patient needs upper endoscopy.  If negative he may eventually come to a colonoscopy . Patient has hemophilia and would probably require factor VIII prior to endoscopic intervention.  His hemophilia is managed by Dr. Gaylyn Cheers with [facility].   I suppose he could have the infusion done locally prior to EGD but the logistics would need to be worked out.  -Patent has several appointment scheduled in [place] over the next several days. Perhaps it makes since to get Factor 8 infusion followed by EGD at [facility]. We will contact Dr. Gaylyn Cheers to discuss further.  -Patient will likely need to establish care with an Oncologist soon.   3. Hemophilia, followed at [facility] by Dr. Gaylyn Cheers  Addendum: Reviewed and agree with initial management. MRI is scheduled for tomorrow. No recent active bleeding, black stools have ceased. I have reached out to Dr. Gaylyn Cheers at [facility] and she is not working today.  I left a message with her nurses and requested a call back.  We need to coordinate care regarding his new dx of cirrhosis, need for EGD and also further management of probable HCC. I will await call back and if no word soon, I will call back and try to reach Dr. Gaylyn Cheers. Hilarie Fredrickson, Lajuan Lines, MD   Addendum: I spoke with Babs Sciara, RN with [facility] hemophilia service.  Mr. Brinkmeyer is well-known to  their clinic. After discussion of the above findings, she recommends that we refer him urgently to see Dr. Maceo Pro with [facility].  Thereafter care can be coordinated at [facility] with hepatology for cirrhosis with probable hepatoma and also GI for EGD.  He will need coverage with factor for all GI procedures which can be coordinated at [facility]. She will contact the patient to advised that he proceed with MRI as scheduled tomorrow.  We advised him of the same last week He is also to be instructed to go to ER at [facility] with any further melena, or acute issues before follow-up and consultation appointments He has an appointment with Dr. Gaylyn Cheers on 12/08/2017 Pyrtle, Lajuan Lines, MD       HPI:    Chief Complaint: Recent GI bleeding and liver mass on CT scan  HPI:   Patient is an 84 year old male referred by PCP Dr. Osborne Casco.  Patient was seen in the emergency department 11/11/2017 for evaluation of gastrointestinal bleeding.  He has a history of chronic intermittent mid lower abdominal pain which became acutely worse on the day he presented to the emergency department.  Additionally patient was having black stool with small amount of bright red blood. No nsaids at home.  In the emergency department hemoglobin was 9.5, it was 10.3 mid January.  CT scan of the abdomen and pelvis described a diffusely thickened colon.  Patient was given Cipro and Flagyl for possible  colitis.  Patient has not had diarrhea no fevers.  CT scan was also remarkable for A 7 x 6.5 x 6 cm mass in the left hepatic lobe concerning for HCC in the setting of cirrhosis.  AFP only mildly elevated at 15.3.  Except for mildly elevated AST of 55 liver chemistries are basically normal. He is mildly coagulopathic and platelet count of only 94.  PCP is ordered an MRCP for further evaluation of liver lesion.  Patient had no knowledge of cirrhosis until it was incidentally found on CT scan earlier this month.  Review of systems:    Positive for arthritis, back  pain, blood in stool, fatigue, muscle pain and cramps.  All other systems reviewed and negative except were noted in the HPI  Past Medical History:  Diagnosis Date  . Hemophilia A ([place])   . Hypertension    Past Surgical History:  Procedure Laterality Date  . left knee replacement     .med Patient's surgical history, family medical history, social history, medications and allergies were all reviewed in Epic    Physical Exam:     BP 112/64   Pulse 80   Wt 138 lb 6.4 oz (62.8 kg)   BMI 21.04 kg/m   GENERAL: Well-developed white male in NAD PSYCH: :Pleasant, cooperative, normal affect EENT:  conjunctiva pink, mucous membranes moist, neck supple without masses CARDIAC:  RRR, no murmur heard, no peripheral edema PULM: Normal respiratory effort, lungs CTA bilaterally, no wheezing ABDOMEN:  Nondistended, soft, nontender. No obvious masses, no hepatomegaly,  normal bowel sounds SKIN:  turgor, no lesions seen Musculoskeletal:  Normal muscle tone, normal strength NEURO: Alert and oriented x 3, no focal neurologic deficits   Tye Savoy , NP 11/19/2017, 3:16 PM  Cc: Domenick Gong, MD

## 2017-11-19 NOTE — Patient Instructions (Addendum)
If you are age 82 or older, your body mass index should be between 23-30. Your Body mass index is 21.04 kg/m. If this is out of the aforementioned range listed, please consider follow up with your Primary Care Provider.  If you are age 93 or younger, your body mass index should be between 19-25. Your Body mass index is 21.04 kg/m. If this is out of the aformentioned range listed, please consider follow up with your Primary Care Provider.   We will be contacting you regarding further care.  Thank you for choosing [facility]   Tye Savoy, NP

## 2017-11-19 NOTE — Progress Notes (Deleted)
Chief Complaint: gastrointestinal bleeding and mass on liver  Referring Provider:   Domenick Gong, MD     ASSESSMENT AND PLAN;       HPI:     Patient is an 84 year old male CT scan the abdomen and pelvis with contrast done for evaluation of abdominal pain and rectal bleeding showed a cirrhotic appearing liver with  a7 x 6.5 x 6 cm mixed attenuating mass in the left hepatic lobe with areas of hypodensity centrally as well as along the periphery concerning for hepatocellular carcinoma in the setting of cirrhosis. AFP on 15.3    Past Medical History:  Diagnosis Date  . Hemophilia A ([place])   . Hypertension      Past Surgical History:  Procedure Laterality Date  . left knee replacement     Family History  Problem Relation Age of Onset  . Hypotension Mother   . Prostate cancer Father    Social History   Tobacco Use  . Smoking status: Former Smoker    Last attempt to quit: 11/19/1956    Years since quitting: 61.0  . Smokeless tobacco: Never Used  Substance Use Topics  . Alcohol use: Never    Frequency: Never  . Drug use: Never   Current Outpatient Medications  Medication Sig Dispense Refill  . atenolol (TENORMIN) 50 MG tablet Take 50 mg by mouth 2 (two) times daily.    Marland Kitchen atorvastatin (LIPITOR) 20 MG tablet Take 20 mg by mouth daily.    . ciprofloxacin (CIPRO) 500 MG tablet Take 1 tablet (500 mg total) by mouth 2 (two) times daily. 20 tablet 0  . isosorbide mononitrate (IMDUR) 30 MG 24 hr tablet Take 30 mg by mouth daily.    . metroNIDAZOLE (FLAGYL) 500 MG tablet Take 1 tablet (500 mg total) by mouth 3 (three) times daily. 30 tablet 0  . morphine (MSIR) 30 MG tablet Take 1 tablet (30 mg total) by mouth every 4 (four) hours as needed. 180 tablet 0  . nitroGLYCERIN (NITROSTAT) 0.4 MG SL tablet Place 0.4 mg under the tongue every 5 (five) minutes as needed for chest pain.    . polyethylene glycol (MIRALAX / GLYCOLAX) packet Take 17 g by mouth daily as needed.    .  Sennosides (EX-LAX) 15 MG TABS Take 1 tablet by mouth as needed.     No current facility-administered medications for this visit.    Allergies  Allergen Reactions  . Aspirin Other (See Comments)    Increased bleeding per hemchromatosis  . Ibuprofen Other (See Comments)    Bleeding per hemechromatosis  . Prednisone Other (See Comments)    Bleeding per hemechromatosis  . Codeine Nausea And Vomiting     Review of Systems: All systems reviewed and negative except where noted in HPI.    Physical Exam:    BP 112/64   Pulse 80   Wt 138 lb 6.4 oz (62.8 kg)   BMI 21.04 kg/m  Constitutional:  Well-developed, ***male in no acute distress. Psychiatric: Normal mood and affect. Behavior is normal. EENT: Pupils normal.  Conjunctivae are normal. No scleral icterus. Neck supple.  Cardiovascular: Normal rate, regular rhythm. No edema Pulmonary/chest: Effort normal and breath sounds normal. No wheezing, rales or rhonchi. Abdominal: Soft, nondistended. Nontender. Bowel sounds active throughout. There are no masses palpable. No hepatomegaly. Neurological: Alert and oriented to person place and time. Skin: Skin is warm and dry. No rashes noted.  Tye Savoy, NP  11/19/2017, 9:21 AM  Tisovec, Fransico Him, MD

## 2017-11-22 ENCOUNTER — Encounter: Payer: Self-pay | Admitting: Nurse Practitioner

## 2017-11-23 NOTE — Progress Notes (Signed)
Referral faxed to [facility] Dr. Maceo Pro for appt. Fax# [phone].

## 2017-11-24 ENCOUNTER — Encounter (HOSPITAL_COMMUNITY): Payer: Self-pay

## 2017-11-24 ENCOUNTER — Telehealth: Payer: Self-pay | Admitting: *Deleted

## 2017-11-24 ENCOUNTER — Other Ambulatory Visit: Payer: Self-pay

## 2017-11-24 ENCOUNTER — Ambulatory Visit (HOSPITAL_COMMUNITY)
Admission: RE | Admit: 2017-11-24 | Discharge: 2017-11-24 | Disposition: A | Discharge status: Home / Self Care | Payer: Medicare HMO | Source: Ambulatory Visit | Attending: Hematology | Admitting: Hematology

## 2017-11-24 ENCOUNTER — Emergency Department (HOSPITAL_COMMUNITY)
Admission: EM | Admit: 2017-11-24 | Discharge: 2017-11-24 | Disposition: A | Discharge status: Home / Self Care | Payer: Medicare HMO | Attending: Emergency Medicine | Admitting: Emergency Medicine

## 2017-11-24 ENCOUNTER — Emergency Department (HOSPITAL_COMMUNITY): Payer: Medicare HMO

## 2017-11-24 DIAGNOSIS — Z87891 Personal history of nicotine dependence: Secondary | ICD-10-CM | POA: Diagnosis not present

## 2017-11-24 DIAGNOSIS — R079 Chest pain, unspecified: Secondary | ICD-10-CM | POA: Diagnosis not present

## 2017-11-24 DIAGNOSIS — Z79899 Other long term (current) drug therapy: Secondary | ICD-10-CM | POA: Diagnosis not present

## 2017-11-24 DIAGNOSIS — I480 Paroxysmal atrial fibrillation: Secondary | ICD-10-CM | POA: Diagnosis not present

## 2017-11-24 DIAGNOSIS — R072 Precordial pain: Secondary | ICD-10-CM | POA: Diagnosis not present

## 2017-11-24 DIAGNOSIS — I10 Essential (primary) hypertension: Secondary | ICD-10-CM | POA: Insufficient documentation

## 2017-11-24 DIAGNOSIS — R110 Nausea: Secondary | ICD-10-CM | POA: Diagnosis not present

## 2017-11-24 DIAGNOSIS — R160 Hepatomegaly, not elsewhere classified: Secondary | ICD-10-CM

## 2017-11-24 DIAGNOSIS — K921 Melena: Secondary | ICD-10-CM | POA: Diagnosis not present

## 2017-11-24 LAB — CBC
HCT: 31.7 % — ABNORMAL LOW (ref 39.0–52.0)
Hemoglobin: 10.1 g/dL — ABNORMAL LOW (ref 13.0–17.0)
MCH: 26.6 pg (ref 26.0–34.0)
MCHC: 31.9 g/dL (ref 30.0–36.0)
MCV: 83.6 fL (ref 78.0–100.0)
PLATELETS: 138 10*3/uL — AB (ref 150–400)
RBC: 3.79 MIL/uL — AB (ref 4.22–5.81)
RDW: 17.6 % — ABNORMAL HIGH (ref 11.5–15.5)
WBC: 8 10*3/uL (ref 4.0–10.5)

## 2017-11-24 LAB — BASIC METABOLIC PANEL
ANION GAP: 11 (ref 5–15)
BUN: 20 mg/dL (ref 6–20)
CHLORIDE: 106 mmol/L (ref 101–111)
CO2: 22 mmol/L (ref 22–32)
CREATININE: 1.13 mg/dL (ref 0.61–1.24)
Calcium: 8.8 mg/dL — ABNORMAL LOW (ref 8.9–10.3)
GFR calc non Af Amer: 58 mL/min — ABNORMAL LOW (ref >60)
Glucose, Bld: 174 mg/dL — ABNORMAL HIGH (ref 65–99)
Potassium: 3.7 mmol/L (ref 3.5–5.1)
Sodium: 139 mmol/L (ref 135–145)

## 2017-11-24 LAB — MAGNESIUM: Magnesium: 1.7 mg/dL (ref 1.7–2.4)

## 2017-11-24 LAB — TSH: TSH: 8.924 u[IU]/mL — ABNORMAL HIGH (ref 0.350–4.500)

## 2017-11-24 LAB — I-STAT CG4 LACTIC ACID, ED
LACTIC ACID, VENOUS: 2.74 mmol/L — AB (ref 0.5–1.9)
Lactic Acid, Venous: 1.7 mmol/L (ref 0.5–1.9)

## 2017-11-24 LAB — I-STAT TROPONIN, ED: TROPONIN I, POC: 0 ng/mL (ref 0.00–0.08)

## 2017-11-24 MED ORDER — SODIUM CHLORIDE 0.9 % IV BOLUS
1000.0000 mL | Freq: Once | INTRAVENOUS | Status: AC
  Administered 2017-11-24: 1000 mL via INTRAVENOUS

## 2017-11-24 MED ORDER — NITROGLYCERIN 0.4 MG SL SUBL
0.4000 mg | SUBLINGUAL_TABLET | SUBLINGUAL | Status: DC | PRN
  Administered 2017-11-24: 0.4 mg via SUBLINGUAL

## 2017-11-24 MED ORDER — NITROGLYCERIN IN D5W 200-5 MCG/ML-% IV SOLN
0.0000 ug/min | Freq: Once | INTRAVENOUS | Status: AC
  Administered 2017-11-24: 5 ug/min via INTRAVENOUS
  Filled 2017-11-24: qty 250

## 2017-11-24 MED ORDER — NITROGLYCERIN 0.4 MG SL SUBL
0.4000 mg | SUBLINGUAL_TABLET | SUBLINGUAL | Status: DC | PRN
  Administered 2017-11-24 (×2): 0.4 mg via SUBLINGUAL

## 2017-11-24 MED ORDER — DILTIAZEM HCL 25 MG/5ML IV SOLN
15.0000 mg | Freq: Once | INTRAVENOUS | Status: AC
  Administered 2017-11-24: 15 mg via INTRAVENOUS
  Filled 2017-11-24: qty 5

## 2017-11-24 MED ORDER — MORPHINE SULFATE (PF) 4 MG/ML IV SOLN
4.0000 mg | Freq: Once | INTRAVENOUS | Status: AC
  Administered 2017-11-24: 4 mg via INTRAVENOUS
  Filled 2017-11-24: qty 1

## 2017-11-24 MED ORDER — NITROGLYCERIN 0.4 MG SL SUBL
SUBLINGUAL_TABLET | SUBLINGUAL | Status: AC
  Filled 2017-11-24: qty 3

## 2017-11-24 MED ORDER — GADOBENATE DIMEGLUMINE 529 MG/ML IV SOLN: 15.0000 mL | Freq: Once | INTRAVENOUS | Status: DC | PRN

## 2017-11-24 NOTE — ED Notes (Signed)
Vital signs stable. 

## 2017-11-24 NOTE — ED Triage Notes (Signed)
Pt out pat MRI- Sudden onset chest pain center chest to BIL arms 10/10 0.4 SL GIVEN in MRI. Now 8/10. Pain constant lasting 12 minutes. Rapid response present Training and development officer responded. EDP Melina Copa present. Rectal bleed "black" 2 days ago

## 2017-11-24 NOTE — Consult Note (Signed)
Cardiology Consult Note  Admit date: 11/24/2017 Name: [name] 84 y.o.  male DOB:  December 17, 1930 MRN:  [id number]  Today's date:  11/24/2017  Referring Physician:    [facility]  Reason for Consultation:   Chest pain atrial fibrillation  IMPRESSIONS: 1.  Likely angina due to rapid atrial fibrillation that has now resolved 2.  CAD with previous stable angina 3.  Hemophilia 4.  Paroxysmal atrial fibrillation currently resolved-not felt to be candidate for systemic anticoagulation due to hemophilia.  RECOMMENDATION: The patient has converted to normal sinus rhythm and is currently asymptomatic.  From a cardiovascular viewpoint he is a poor candidate for systemic anticoagulation due to his hemophilia.  He is not really a good candidate for coronary artery stenting and his angina has been stable.  From a cardiac viewpoint I don't think there is a lot else to do.  We can get an echo as an outpatient.  I would continue him on his atenolol and his long-acting nitrates.  HISTORY: This 84 year old male has been seen by me for a number of years.  He has a history of clinical angina that we have treated medically.  He has known hemophilia and has had multiple joint complications.  He recently had a liver lesion diagnosed and was in the process of having a liver consultation as well as an MRI of his liver today when he developed the onset of rapid heart beat and substernal chest pain.  He was brought to the emergency room where he was found to have rapid atrial fibrillation.  He was given intravenous diltiazem and when I finally came to see him he had converted to sinus rhythm he currently feels quite well.  He has had episodic bouts of tachycardia going back several years that are infrequent.  Past Medical History:  Diagnosis Date  . 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 Hypertension       Past Surgical History:  Procedure Laterality Date  . left knee replacement       Allergies:  is  allergic to aspirin; ibuprofen; prednisone; and codeine.   Medications: Prior to Admission medications   Medication Sig Start Date End Date Taking? Authorizing Provider  atenolol (TENORMIN) 50 MG tablet Take 50 mg by mouth 2 (two) times daily.    [provider]  atorvastatin (LIPITOR) 20 MG tablet Take 20 mg by mouth daily.    [provider]  ciprofloxacin (CIPRO) 500 MG tablet Take 1 tablet (500 mg total) by mouth 2 (two) times daily. 11/11/17   Kirichenko, Tatyana, PA-C  isosorbide mononitrate (IMDUR) 30 MG 24 hr tablet Take 30 mg by mouth daily. 11/09/17   [provider]  metroNIDAZOLE (FLAGYL) 500 MG tablet Take 1 tablet (500 mg total) by mouth 3 (three) times daily. 11/11/17   Kirichenko, Tatyana, PA-C  morphine (MSIR) 30 MG tablet Take 1 tablet (30 mg total) by mouth every 4 (four) hours as needed. 10/26/17   Magrinat, Virgie Dad, MD  nitroGLYCERIN (NITROSTAT) 0.4 MG SL tablet Place 0.4 mg under the tongue every 5 (five) minutes as needed for chest pain.    [provider]  polyethylene glycol (MIRALAX / GLYCOLAX) packet Take 17 g by mouth daily as needed.    [provider]  Sennosides (EX-LAX) 15 MG TABS Take 1 tablet by mouth as needed.    [provider]    Family History: Family Status  Relation Name Status  . Mother  (Not Specified)  .  Father  (Not Specified)   Social History:   reports that he quit smoking about 61 years ago. He has never used smokeless tobacco. He reports that he does not drink alcohol or use drugs.   Review of Systems: He has significant chronic back pain as well as chronic joint pain.  Other than as noted above the remainder of the review of systems is unremarkable.  Physical Exam: BP (!) 105/58   Pulse (!) 56   Resp 17   SpO2 97%   General appearance: he is a pleasant elderly white male in no acute distress Head: Normocephalic, without obvious abnormality, atraumatic Eyes: conjunctivae/corneas  clear. PERRL, EOM's intact. Fundi benign. Neck: no adenopathy, no carotid bruit, no JVD and supple, symmetrical, trachea midline Lungs: clear to auscultation bilaterally Heart: regular rate and rhythm, S1, S2 normal, no murmur, click, rub or gallop Abdomen: soft, non-tender; bowel sounds normal; no masses,  no organomegaly Rectal: deferred Extremities: previous scar from knee replacement, some swelling of joints noted, no edema. Pulses: 2+ and symmetric Skin: Skin color, texture, turgor normal. No rashes or lesions Neurologic: Grossly normal Psych: Alert and oriented x 3 Labs: CBC Recent Labs    11/24/17 1526  WBC 8.0  RBC 3.79*  HGB 10.1*  HCT 31.7*  PLT 138*  MCV 83.6  MCH 26.6  MCHC 31.9  RDW 17.6*   CMP  Recent Labs    11/24/17 1526  NA 139  K 3.7  CL 106  CO2 22  GLUCOSE 174*  BUN 20  CREATININE 1.13  CALCIUM 8.8*  GFRNONAA 58*  GFRAA >60   BNP (last 3 results) Cardiac Panel (last 3 results) Troponin (Point of Care Test) Recent Labs    11/24/17 1526  TROPIPOC 0.00    Radiology:  Mild cardiac enlargement, fluffy infiltrates noted  EKG: Initial EKG shows rapid atrial fibrillation with ST depression in the high lateral and lateral leads.  Subsequent EKG following conversion to sinus rhythm shows some lateral T-wave inversions, left axis deviation Independently reviewed by me  Signed:  W. Doristine Church MD [facility]   Cardiology Consultant  11/24/2017, 5:03 PM

## 2017-11-24 NOTE — Progress Notes (Signed)
   11/24/17 1600  Clinical Encounter Type  Visited With Patient and family together;Health care provider  Visit Type ED;Code  Referral From Nurse  Consult/Referral To Chaplain  Spiritual Encounters  Spiritual Needs Emotional;Prayer   Responded to a Code for Rapid Response to the MRI.  Medical team indicated daughter had gone home to get medicine.  Chaplain Jerene Pitch waited for the daughter and I went with the patient and medical team to the ED.  Stayed with the patient until daughter arrived and was at bedside.  Went back and patient was doint better and appreciated the support.  We all prayed together.  Will follow and support as needed. Chaplain Katherene Ponto

## 2017-11-24 NOTE — ED Notes (Signed)
Elevated Lactic reported to Chong Sicilian, RN and Melina Copa, MD

## 2017-11-24 NOTE — ED Notes (Signed)
CARDIOLOGY AT BEDSIDE

## 2017-11-24 NOTE — Telephone Encounter (Signed)
Received call from [name] at [name] regarding procedure. He stated,"[name] came in today for an MRI of abdomen. He started having chest pains and became diaphoretic. Rapid Response was called and Nitroglycerin administered. He was then transported to the ER. MRI was cancelled. Informed [name] I would give this message to [name].

## 2017-11-24 NOTE — Telephone Encounter (Signed)
OK, got it, thanks.   Truitt Merle MD

## 2017-11-24 NOTE — ED Notes (Addendum)
ED Provider at bedside. EDP BULTER AT BEDSIDE AWARE OF VITALS. HR 44-51 NITRO STOPPED. BP 117/68. PT PAIN 6/10 FAMILY PRESENT AND UPDATED.

## 2017-11-24 NOTE — ED Notes (Signed)
ED Provider at bedside. 

## 2017-11-24 NOTE — Discharge Instructions (Signed)
You were evaluated in the emergency department for chest pain in the setting of a rapid heart rate.  Your EKG showed that you were in atrial fibrillation.  Once he returned to a normal sinus rhythm your pain improved and is resolved.  You were evaluated by your heart doctor and he felt you could return home and continue your regular medicines.  You should also follow-up with your primary care doctor as your thyroid test was a little elevated will need follow-up.  Please return if any worsening symptoms.

## 2017-11-24 NOTE — ED Provider Notes (Signed)
[facility] DEPT Provider Note   CSN: [id number] Arrival date & time: 11/24/17  1514     History   Chief Complaint Chief Complaint  Patient presents with  . Chest Pain  . Nausea  . Rectal Bleeding    HPI [name] is a 84 y.o. male.  He was here today getting an outpatient MRI of his abdomen and had not received any medication.  A rapid response was called for acute onset of chest pain difficulty breathing.  He is complaining of substernal chest pain rating in both arms with associated shortness of breath that occurred just prior to arrival.  States he is gotten these episodes before sometimes once a month sometimes 3 times a week.  They are usually responsive to nitro and he considers these his heart condition.  He denies ever having a heart attack.  The history is provided by the patient.  Chest Pain   This is a recurrent problem. The current episode started less than 1 hour ago. The problem occurs constantly. The problem has not changed since onset.The pain is associated with rest. The pain is present in the substernal region. The pain is at a severity of 8/10. The quality of the pain is described as pressure-like. The pain radiates to the left arm and right arm. Associated symptoms include diaphoresis and nausea. Pertinent negatives include no abdominal pain, no back pain, no cough, no fever, no hemoptysis, no palpitations, no shortness of breath and no vomiting. He has tried nitroglycerin for the symptoms. The treatment provided mild relief. Risk factors include male gender.  His past medical history is significant for hypertension.  Pertinent negatives for past medical history include no seizures.  Rectal Bleeding  Associated symptoms: no abdominal pain, no fever and no vomiting     Past Medical History:  Diagnosis Date  . 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 Hypertension     Patient Active Problem List   Diagnosis Date Noted  . Chronic pain  04/18/2015  . Angina pectoris ([place])   . Hyperlipidemia   . 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 left knee replacement          Home Medications    Prior to Admission medications   Medication Sig Start Date End Date Taking? Authorizing Provider  atenolol (TENORMIN) 50 MG tablet Take 50 mg by mouth 2 (two) times daily.    [provider]  atorvastatin (LIPITOR) 20 MG tablet Take 20 mg by mouth daily.    [provider]  ciprofloxacin (CIPRO) 500 MG tablet Take 1 tablet (500 mg total) by mouth 2 (two) times daily. 11/11/17   Kirichenko, Tatyana, PA-C  isosorbide mononitrate (IMDUR) 30 MG 24 hr tablet Take 30 mg by mouth daily. 11/09/17   [provider]  metroNIDAZOLE (FLAGYL) 500 MG tablet Take 1 tablet (500 mg total) by mouth 3 (three) times daily. 11/11/17   Kirichenko, Tatyana, PA-C  morphine (MSIR) 30 MG tablet Take 1 tablet (30 mg total) by mouth every 4 (four) hours as needed. 10/26/17   Magrinat, Virgie Dad, MD  nitroGLYCERIN (NITROSTAT) 0.4 MG SL tablet Place 0.4 mg under the tongue every 5 (five) minutes as needed for chest pain.    [provider]  polyethylene glycol (MIRALAX / GLYCOLAX) packet Take 17 g by mouth daily as needed.    [provider]  Sennosides (EX-LAX) 15 MG TABS Take 1 tablet by  mouth as needed.    [provider]    Family History Family History  Problem Relation Age of Onset  . Hypotension Mother   . Prostate cancer Father     Social History Social History   Tobacco Use  . Smoking status: Former Smoker    Last attempt to quit: 11/19/1956    Years since quitting: 61.0  . Smokeless tobacco: Never Used  Substance Use Topics  . Alcohol use: Never    Frequency: Never  . Drug use: Never     Allergies   Aspirin; Ibuprofen; Prednisone; and Codeine   Review of Systems Review of Systems  Constitutional: Positive for diaphoresis. Negative for  chills and fever.  HENT: Negative for ear pain and sore throat.   Eyes: Negative for pain and visual disturbance.  Respiratory: Negative for cough, hemoptysis and shortness of breath.   Cardiovascular: Positive for chest pain. Negative for palpitations.  Gastrointestinal: Positive for blood in stool, hematochezia and nausea. Negative for abdominal pain and vomiting.  Genitourinary: Negative for dysuria and hematuria.  Musculoskeletal: Negative for arthralgias and back pain.  Skin: Negative for color change and rash.  Neurological: Negative for seizures and syncope.  Hematological: Bruises/bleeds easily.  All other systems reviewed and are negative.    Physical Exam Updated Vital Signs SpO2 97% Comment: 2LNC  Physical Exam  Constitutional: He appears well-developed and well-nourished.  Non-toxic appearance. He appears distressed.  HENT:  Head: Normocephalic and atraumatic.  Eyes: Conjunctivae are normal.  Neck: Neck supple.  Cardiovascular: An irregularly irregular rhythm present. Tachycardia present.  No murmur heard. Pulmonary/Chest: Effort normal and breath sounds normal. No respiratory distress.  Abdominal: Soft. There is no tenderness.  Musculoskeletal: He exhibits no edema.       Right lower leg: Normal. He exhibits no tenderness and no edema.       Left lower leg: Normal. He exhibits no tenderness and no edema.  Neurological: He is alert. He has normal strength. No cranial nerve deficit or sensory deficit. GCS eye subscore is 4. GCS verbal subscore is 5. GCS motor subscore is 6.  Skin: Skin is warm. Capillary refill takes less than 2 seconds. He is diaphoretic. There is pallor.  Psychiatric: He has a normal mood and affect.  Nursing note and vitals reviewed.    ED Treatments / Results  Labs (all labs ordered are listed, but only abnormal results are displayed) Labs Reviewed  BASIC METABOLIC PANEL - Abnormal; Notable for the following components:      Result Value    Glucose, Bld 174 (*)    Calcium 8.8 (*)    GFR calc non Af Amer 58 (*)    All other components within normal limits  CBC - Abnormal; Notable for the following components:   RBC 3.79 (*)    Hemoglobin 10.1 (*)    HCT 31.7 (*)    RDW 17.6 (*)    Platelets 138 (*)    All other components within normal limits  TSH - Abnormal; Notable for the following components:   TSH 8.924 (*)    All other components within normal limits  I-STAT CG4 LACTIC ACID, ED - Abnormal; Notable for the following components:   Lactic Acid, Venous 2.74 (*)    All other components within normal limits  MAGNESIUM  I-STAT TROPONIN, ED  I-STAT CG4 LACTIC ACID, ED  I-STAT CG4 LACTIC ACID, ED    EKG None  Radiology Dg Chest Portable 1 View  Result Date: 11/24/2017  CLINICAL DATA:  Chest pain EXAM: PORTABLE CHEST 1 VIEW COMPARISON:  06/24/2004 chest radiograph. FINDINGS: Mild enlargement of the cardiopericardial silhouette, new from prior chest radiograph. Otherwise normal mediastinal contour. Fluffy and linear diffuse parahilar lung opacities bilaterally. No pneumothorax. Trace right pleural effusion. No left pleural effusion. IMPRESSION: New mild enlargement of the cardiopericardial silhouette. Diffuse fluffy and linear parahilar lung opacities. Trace right pleural effusion. Findings are most suggestive of acute congestive heart failure. Electronically Signed   By: Ilona Sorrel M.D.   On: 11/24/2017 16:13    Procedures .Critical Care Performed by: Hayden Rasmussen, MD Authorized by: Hayden Rasmussen, MD   Critical care provider statement:    Critical care time (minutes):  30   Critical care time was exclusive of:  Separately billable procedures and treating other patients   Critical care was necessary to treat or prevent imminent or life-threatening deterioration of the following conditions:  Cardiac failure   Critical care was time spent personally by me on the following activities:  Development of treatment  plan with patient or surrogate, discussions with consultants, evaluation of patient's response to treatment, examination of patient, obtaining history from patient or surrogate, ordering and performing treatments and interventions, ordering and review of laboratory studies, ordering and review of radiographic studies, pulse oximetry, re-evaluation of patient's condition and review of old charts   I assumed direction of critical care for this patient from another provider in my specialty: no     (including critical care time)  Medications Ordered in ED Medications  morphine 4 MG/ML injection 4 mg (has no administration in time range)  nitroGLYCERIN (NITROSTAT) SL tablet 0.4 mg (has no administration in time range)  sodium chloride 0.9 % bolus 1,000 mL (has no administration in time range)     Initial Impression / Assessment and Plan / ED Course  I have reviewed the triage vital signs and the nursing notes.  Pertinent labs & imaging results that were available during my care of the patient were reviewed by me and considered in my medical decision making (see chart for details).  Clinical Course as of Nov 26 1223  Wed Nov 24, 2017  1537 ECG is A. fib rate of 139 with old Q waves anteriorly does have ST depressions laterally and possible inferior elevation inferiorly   [MB]  1538 Repeat EKG shows less concerning inferior ST elevations.  Continues with lateral ST depressions and T wave inversions.  Discussed with cardiology STEMI group and they will review his EKGs and get back to me.  Ordered Cardizem and patient cannot take aspirin due to his hemophilia.   [MB]  3810 Discussed with Barbaraann Rondo from STEMI lab who reviewed EKGs with STEMI attending.  They do not feel he needs emergent activation in order to come over initially.  I discussed with Dr. Wynonia Lawman the patient's primary cardiologist who states he is never been in A. fib before and recommends rate control and pain control as he would not be a  candidate for any kind of stenting or antiplatelet therapy.   [MB]  1622 Evaluation.  Heart rate 50s to 60s.  Blood pressure 110s.  He still complaining of 5 out of 10 chest pain but looking much more comfortable in the bed.  Nitro has been held due to low blood pressure and low heart rate.  IV fluids going.  Labs are coming back with initial troponins negative normal electrolytes including magnesium hematocrit baseline for him.  Lactate was elevated at 2.74 on  arrival and will need to be rechecked.   [MB]  7615 Reevaluated.  Heart rate 50s pressure 110s pain-free currently.  Nitro off.   [MB]  1834 patients primary cardiologist Dr. Wynonia Lawman evaluated him in the ED.  Patient is currently in sinus rhythm and is pain-free.  Dr. Wynonia Lawman recommends that if he remains stable he would not anticoagulate him and does not feel he needs to be admitted for observation.  He would follow the patient up in the office.  Patient is agreeable to this.   [MB]  3735 Reevaluated patient.  He feels back to baseline and his heart rate and blood pressure been good.  We will give him an ambulation trial and if he continues to do well he possibly can be discharged to follow-up as an outpatient.   [MB]  2025 Patient ambulated here without any difficulty with his cane.  He is comfortable going home.   [MB]    Clinical Course User Index [MB] Hayden Rasmussen, MD     Final Clinical Impressions(s) / ED Diagnoses   Final diagnoses:  Paroxysmal atrial fibrillation [facility])  Chest pain, unspecified type    ED Discharge Orders    None       Hayden Rasmussen, MD 11/25/17 1226

## 2017-11-24 NOTE — ED Notes (Signed)
Spiritual care responded to Rapid Response in MRI.  [place], Chaplain [place] and myself provided support in MRI and ED Resus B.    [facility] / [facility] Chaplain Jerene Pitch, MDiv, [facility]

## 2017-11-25 ENCOUNTER — Ambulatory Visit: Payer: Self-pay | Admitting: Nurse Practitioner

## 2017-11-25 DIAGNOSIS — D66 Hereditary factor VIII deficiency: Secondary | ICD-10-CM | POA: Diagnosis not present

## 2017-11-26 ENCOUNTER — Telehealth: Payer: Self-pay

## 2017-11-26 DIAGNOSIS — R160 Hepatomegaly, not elsewhere classified: Secondary | ICD-10-CM | POA: Diagnosis not present

## 2017-11-26 DIAGNOSIS — R0602 Shortness of breath: Secondary | ICD-10-CM | POA: Diagnosis not present

## 2017-11-26 DIAGNOSIS — M2550 Pain in unspecified joint: Secondary | ICD-10-CM | POA: Diagnosis not present

## 2017-11-26 DIAGNOSIS — R531 Weakness: Secondary | ICD-10-CM | POA: Diagnosis not present

## 2017-11-26 DIAGNOSIS — I481 Persistent atrial fibrillation: Secondary | ICD-10-CM | POA: Diagnosis not present

## 2017-11-26 DIAGNOSIS — K7689 Other specified diseases of liver: Secondary | ICD-10-CM | POA: Diagnosis not present

## 2017-11-26 DIAGNOSIS — R1030 Lower abdominal pain, unspecified: Secondary | ICD-10-CM | POA: Diagnosis not present

## 2017-11-26 DIAGNOSIS — G5603 Carpal tunnel syndrome, bilateral upper limbs: Secondary | ICD-10-CM | POA: Diagnosis not present

## 2017-11-26 DIAGNOSIS — D500 Iron deficiency anemia secondary to blood loss (chronic): Secondary | ICD-10-CM | POA: Diagnosis not present

## 2017-11-26 DIAGNOSIS — R0600 Dyspnea, unspecified: Secondary | ICD-10-CM | POA: Diagnosis not present

## 2017-11-26 DIAGNOSIS — R202 Paresthesia of skin: Secondary | ICD-10-CM | POA: Diagnosis not present

## 2017-11-26 DIAGNOSIS — G8929 Other chronic pain: Secondary | ICD-10-CM | POA: Diagnosis not present

## 2017-11-26 DIAGNOSIS — J811 Chronic pulmonary edema: Secondary | ICD-10-CM | POA: Diagnosis not present

## 2017-11-26 DIAGNOSIS — I4891 Unspecified atrial fibrillation: Secondary | ICD-10-CM | POA: Diagnosis not present

## 2017-11-26 DIAGNOSIS — I480 Paroxysmal atrial fibrillation: Secondary | ICD-10-CM | POA: Diagnosis not present

## 2017-11-26 DIAGNOSIS — G5602 Carpal tunnel syndrome, left upper limb: Secondary | ICD-10-CM | POA: Diagnosis not present

## 2017-11-26 DIAGNOSIS — J9811 Atelectasis: Secondary | ICD-10-CM | POA: Diagnosis not present

## 2017-11-26 DIAGNOSIS — R578 Other shock: Secondary | ICD-10-CM | POA: Diagnosis not present

## 2017-11-26 DIAGNOSIS — D649 Anemia, unspecified: Secondary | ICD-10-CM | POA: Diagnosis not present

## 2017-11-26 DIAGNOSIS — R079 Chest pain, unspecified: Secondary | ICD-10-CM | POA: Diagnosis not present

## 2017-11-26 DIAGNOSIS — Z515 Encounter for palliative care: Secondary | ICD-10-CM | POA: Diagnosis not present

## 2017-11-26 DIAGNOSIS — R110 Nausea: Secondary | ICD-10-CM | POA: Diagnosis not present

## 2017-11-26 DIAGNOSIS — R0689 Other abnormalities of breathing: Secondary | ICD-10-CM | POA: Diagnosis not present

## 2017-11-26 DIAGNOSIS — G5622 Lesion of ulnar nerve, left upper limb: Secondary | ICD-10-CM | POA: Diagnosis not present

## 2017-11-26 DIAGNOSIS — I5020 Unspecified systolic (congestive) heart failure: Secondary | ICD-10-CM | POA: Diagnosis not present

## 2017-11-26 DIAGNOSIS — K766 Portal hypertension: Secondary | ICD-10-CM | POA: Diagnosis not present

## 2017-11-26 DIAGNOSIS — E872 Acidosis: Secondary | ICD-10-CM | POA: Diagnosis not present

## 2017-11-26 DIAGNOSIS — R162 Hepatomegaly with splenomegaly, not elsewhere classified: Secondary | ICD-10-CM | POA: Diagnosis not present

## 2017-11-26 DIAGNOSIS — R1084 Generalized abdominal pain: Secondary | ICD-10-CM | POA: Diagnosis not present

## 2017-11-26 DIAGNOSIS — N170 Acute kidney failure with tubular necrosis: Secondary | ICD-10-CM | POA: Diagnosis not present

## 2017-11-26 DIAGNOSIS — R918 Other nonspecific abnormal finding of lung field: Secondary | ICD-10-CM | POA: Diagnosis not present

## 2017-11-26 DIAGNOSIS — I509 Heart failure, unspecified: Secondary | ICD-10-CM | POA: Diagnosis not present

## 2017-11-26 DIAGNOSIS — D696 Thrombocytopenia, unspecified: Secondary | ICD-10-CM | POA: Diagnosis not present

## 2017-11-26 DIAGNOSIS — I5022 Chronic systolic (congestive) heart failure: Secondary | ICD-10-CM | POA: Diagnosis not present

## 2017-11-26 DIAGNOSIS — B1920 Unspecified viral hepatitis C without hepatic coma: Secondary | ICD-10-CM | POA: Diagnosis not present

## 2017-11-26 DIAGNOSIS — R1011 Right upper quadrant pain: Secondary | ICD-10-CM | POA: Diagnosis not present

## 2017-11-26 DIAGNOSIS — I5023 Acute on chronic systolic (congestive) heart failure: Secondary | ICD-10-CM | POA: Diagnosis not present

## 2017-11-26 DIAGNOSIS — R109 Unspecified abdominal pain: Secondary | ICD-10-CM | POA: Diagnosis not present

## 2017-11-26 DIAGNOSIS — K921 Melena: Secondary | ICD-10-CM | POA: Diagnosis not present

## 2017-11-26 DIAGNOSIS — D66 Hereditary factor VIII deficiency: Secondary | ICD-10-CM | POA: Diagnosis not present

## 2017-11-26 DIAGNOSIS — G5621 Lesion of ulnar nerve, right upper limb: Secondary | ICD-10-CM | POA: Diagnosis not present

## 2017-11-26 DIAGNOSIS — J90 Pleural effusion, not elsewhere classified: Secondary | ICD-10-CM | POA: Diagnosis not present

## 2017-11-26 DIAGNOSIS — I959 Hypotension, unspecified: Secondary | ICD-10-CM | POA: Diagnosis not present

## 2017-11-26 DIAGNOSIS — I499 Cardiac arrhythmia, unspecified: Secondary | ICD-10-CM | POA: Diagnosis not present

## 2017-11-26 DIAGNOSIS — G5623 Lesion of ulnar nerve, bilateral upper limbs: Secondary | ICD-10-CM | POA: Diagnosis not present

## 2017-11-26 DIAGNOSIS — G5601 Carpal tunnel syndrome, right upper limb: Secondary | ICD-10-CM | POA: Diagnosis not present

## 2017-11-26 DIAGNOSIS — R0789 Other chest pain: Secondary | ICD-10-CM | POA: Diagnosis not present

## 2017-11-26 DIAGNOSIS — N179 Acute kidney failure, unspecified: Secondary | ICD-10-CM | POA: Diagnosis not present

## 2017-11-26 DIAGNOSIS — I4892 Unspecified atrial flutter: Secondary | ICD-10-CM | POA: Diagnosis not present

## 2017-11-26 DIAGNOSIS — K7460 Unspecified cirrhosis of liver: Secondary | ICD-10-CM | POA: Diagnosis not present

## 2017-11-26 DIAGNOSIS — I82612 Acute embolism and thrombosis of superficial veins of left upper extremity: Secondary | ICD-10-CM | POA: Diagnosis not present

## 2017-11-26 DIAGNOSIS — R601 Generalized edema: Secondary | ICD-10-CM | POA: Diagnosis not present

## 2017-11-26 DIAGNOSIS — T886XXA Anaphylactic reaction due to adverse effect of correct drug or medicament properly administered, initial encounter: Secondary | ICD-10-CM | POA: Diagnosis not present

## 2017-11-26 DIAGNOSIS — I214 Non-ST elevation (NSTEMI) myocardial infarction: Secondary | ICD-10-CM | POA: Diagnosis not present

## 2017-11-26 DIAGNOSIS — I21A1 Myocardial infarction type 2: Secondary | ICD-10-CM | POA: Diagnosis not present

## 2017-11-26 DIAGNOSIS — I4581 Long QT syndrome: Secondary | ICD-10-CM | POA: Diagnosis not present

## 2017-11-26 DIAGNOSIS — R188 Other ascites: Secondary | ICD-10-CM | POA: Diagnosis not present

## 2017-11-26 DIAGNOSIS — R9341 Abnormal radiologic findings on diagnostic imaging of renal pelvis, ureter, or bladder: Secondary | ICD-10-CM | POA: Diagnosis not present

## 2017-11-26 DIAGNOSIS — I4949 Other premature depolarization: Secondary | ICD-10-CM | POA: Diagnosis not present

## 2017-11-26 DIAGNOSIS — K7469 Other cirrhosis of liver: Secondary | ICD-10-CM | POA: Diagnosis not present

## 2017-11-26 DIAGNOSIS — B182 Chronic viral hepatitis C: Secondary | ICD-10-CM | POA: Diagnosis not present

## 2017-11-26 DIAGNOSIS — K769 Liver disease, unspecified: Secondary | ICD-10-CM | POA: Diagnosis not present

## 2017-11-26 NOTE — Progress Notes (Signed)
Called central scheduling to initiate scheduling of MRI ordered by Dr. Burr Medico on 11/18/17. Central scheduling will call patient and/or patient's daughter to get MRI appointment time and date.

## 2017-11-26 NOTE — Telephone Encounter (Signed)
Called pts daughter to talk to her about scheduling pt's MRI and she said that he had had a heart attack when they were getting him ready to go in the MRI machine on the 27th. She said that she is taking her dad to [facility] today to "get checked out." She is not sure if he will get oncology services there. She will call me back to let me know. His new pt with Dr. Burr Medico is 12/02/17.

## 2017-11-30 DIAGNOSIS — G5621 Lesion of ulnar nerve, right upper limb: Secondary | ICD-10-CM | POA: Diagnosis not present

## 2017-11-30 DIAGNOSIS — G5623 Lesion of ulnar nerve, bilateral upper limbs: Secondary | ICD-10-CM | POA: Diagnosis not present

## 2017-11-30 DIAGNOSIS — G5601 Carpal tunnel syndrome, right upper limb: Secondary | ICD-10-CM | POA: Diagnosis not present

## 2017-11-30 DIAGNOSIS — G5603 Carpal tunnel syndrome, bilateral upper limbs: Secondary | ICD-10-CM | POA: Diagnosis not present

## 2017-11-30 DIAGNOSIS — R202 Paresthesia of skin: Secondary | ICD-10-CM | POA: Diagnosis not present

## 2017-11-30 DIAGNOSIS — G5602 Carpal tunnel syndrome, left upper limb: Secondary | ICD-10-CM | POA: Diagnosis not present

## 2017-11-30 DIAGNOSIS — G5622 Lesion of ulnar nerve, left upper limb: Secondary | ICD-10-CM | POA: Diagnosis not present

## 2017-12-02 ENCOUNTER — Ambulatory Visit: Payer: Self-pay | Admitting: Hematology

## 2017-12-02 ENCOUNTER — Other Ambulatory Visit: Payer: Self-pay | Admitting: Oncology

## 2017-12-07 ENCOUNTER — Telehealth: Payer: Self-pay

## 2017-12-07 NOTE — Telephone Encounter (Signed)
Called and spoke with patient's daughter, Lovey Newcomer. She states [name] remains hospitalized with "heart and stomach problems." She stated that he will be treated at [facility]. I encouraged her that call me if things change and he would like to be evaluated at [facility].

## 2017-12-16 DIAGNOSIS — B182 Chronic viral hepatitis C: Secondary | ICD-10-CM | POA: Diagnosis not present

## 2017-12-16 DIAGNOSIS — D649 Anemia, unspecified: Secondary | ICD-10-CM | POA: Diagnosis not present

## 2017-12-16 DIAGNOSIS — I5020 Unspecified systolic (congestive) heart failure: Secondary | ICD-10-CM | POA: Diagnosis not present

## 2017-12-16 DIAGNOSIS — I214 Non-ST elevation (NSTEMI) myocardial infarction: Secondary | ICD-10-CM | POA: Diagnosis not present

## 2017-12-16 DIAGNOSIS — K5790 Diverticulosis of intestine, part unspecified, without perforation or abscess without bleeding: Secondary | ICD-10-CM | POA: Diagnosis not present

## 2017-12-16 DIAGNOSIS — I4891 Unspecified atrial fibrillation: Secondary | ICD-10-CM | POA: Diagnosis not present

## 2017-12-16 DIAGNOSIS — D6489 Other specified anemias: Secondary | ICD-10-CM | POA: Diagnosis not present

## 2017-12-16 DIAGNOSIS — K7460 Unspecified cirrhosis of liver: Secondary | ICD-10-CM | POA: Diagnosis not present

## 2017-12-16 DIAGNOSIS — K4090 Unilateral inguinal hernia, without obstruction or gangrene, not specified as recurrent: Secondary | ICD-10-CM | POA: Diagnosis not present

## 2017-12-16 DIAGNOSIS — G8929 Other chronic pain: Secondary | ICD-10-CM | POA: Diagnosis not present

## 2017-12-17 ENCOUNTER — Other Ambulatory Visit: Payer: Self-pay

## 2017-12-17 DIAGNOSIS — K4090 Unilateral inguinal hernia, without obstruction or gangrene, not specified as recurrent: Secondary | ICD-10-CM | POA: Diagnosis not present

## 2017-12-17 DIAGNOSIS — B182 Chronic viral hepatitis C: Secondary | ICD-10-CM | POA: Diagnosis not present

## 2017-12-17 DIAGNOSIS — I214 Non-ST elevation (NSTEMI) myocardial infarction: Secondary | ICD-10-CM | POA: Diagnosis not present

## 2017-12-17 DIAGNOSIS — K5790 Diverticulosis of intestine, part unspecified, without perforation or abscess without bleeding: Secondary | ICD-10-CM | POA: Diagnosis not present

## 2017-12-17 DIAGNOSIS — G8929 Other chronic pain: Secondary | ICD-10-CM | POA: Diagnosis not present

## 2017-12-17 DIAGNOSIS — D649 Anemia, unspecified: Secondary | ICD-10-CM | POA: Diagnosis not present

## 2017-12-17 DIAGNOSIS — K7460 Unspecified cirrhosis of liver: Secondary | ICD-10-CM | POA: Diagnosis not present

## 2017-12-17 DIAGNOSIS — I5020 Unspecified systolic (congestive) heart failure: Secondary | ICD-10-CM | POA: Diagnosis not present

## 2017-12-17 DIAGNOSIS — I4891 Unspecified atrial fibrillation: Secondary | ICD-10-CM | POA: Diagnosis not present

## 2017-12-17 NOTE — Patient Outreach (Signed)
[facility]) Care Management  12/17/2017  MUKUND WEINREB 04/10/1931 [id number]  Transition of care  Referral date: 12/16/17 Referral source: discharged from [facility]  On 12/14/17 Insurance: [employer] Medicare Attempt #1  Telephone call to patient regarding transition of care follow up. Contact answering phone stated she was patients daughter, Brain Hilts.  HIPAA verified for patient by  Daughter. Explained reason for call. Daughter states this was not a good time for call and request call back on another day.   PLAN: RNCM will attempt 2nd telephone call to patient and/ or daughter wtihin 4 business days.  RNCM will send outreach letter to contact.   Quinn Plowman RN,BSN,CCM [facility] Telephonic  [phone]

## 2017-12-20 DIAGNOSIS — K7460 Unspecified cirrhosis of liver: Secondary | ICD-10-CM | POA: Diagnosis not present

## 2017-12-20 DIAGNOSIS — I214 Non-ST elevation (NSTEMI) myocardial infarction: Secondary | ICD-10-CM | POA: Diagnosis not present

## 2017-12-20 DIAGNOSIS — I5020 Unspecified systolic (congestive) heart failure: Secondary | ICD-10-CM | POA: Diagnosis not present

## 2017-12-20 DIAGNOSIS — D649 Anemia, unspecified: Secondary | ICD-10-CM | POA: Diagnosis not present

## 2017-12-20 DIAGNOSIS — I4891 Unspecified atrial fibrillation: Secondary | ICD-10-CM | POA: Diagnosis not present

## 2017-12-20 DIAGNOSIS — B182 Chronic viral hepatitis C: Secondary | ICD-10-CM | POA: Diagnosis not present

## 2017-12-20 DIAGNOSIS — K4090 Unilateral inguinal hernia, without obstruction or gangrene, not specified as recurrent: Secondary | ICD-10-CM | POA: Diagnosis not present

## 2017-12-20 DIAGNOSIS — G8929 Other chronic pain: Secondary | ICD-10-CM | POA: Diagnosis not present

## 2017-12-20 DIAGNOSIS — K5790 Diverticulosis of intestine, part unspecified, without perforation or abscess without bleeding: Secondary | ICD-10-CM | POA: Diagnosis not present

## 2017-12-21 DIAGNOSIS — K7460 Unspecified cirrhosis of liver: Secondary | ICD-10-CM | POA: Diagnosis not present

## 2017-12-21 DIAGNOSIS — I5020 Unspecified systolic (congestive) heart failure: Secondary | ICD-10-CM | POA: Diagnosis not present

## 2017-12-21 DIAGNOSIS — I4891 Unspecified atrial fibrillation: Secondary | ICD-10-CM | POA: Diagnosis not present

## 2017-12-21 DIAGNOSIS — D649 Anemia, unspecified: Secondary | ICD-10-CM | POA: Diagnosis not present

## 2017-12-21 DIAGNOSIS — B182 Chronic viral hepatitis C: Secondary | ICD-10-CM | POA: Diagnosis not present

## 2017-12-21 DIAGNOSIS — K4090 Unilateral inguinal hernia, without obstruction or gangrene, not specified as recurrent: Secondary | ICD-10-CM | POA: Diagnosis not present

## 2017-12-21 DIAGNOSIS — K5790 Diverticulosis of intestine, part unspecified, without perforation or abscess without bleeding: Secondary | ICD-10-CM | POA: Diagnosis not present

## 2017-12-21 DIAGNOSIS — G8929 Other chronic pain: Secondary | ICD-10-CM | POA: Diagnosis not present

## 2017-12-21 DIAGNOSIS — I214 Non-ST elevation (NSTEMI) myocardial infarction: Secondary | ICD-10-CM | POA: Diagnosis not present

## 2017-12-22 ENCOUNTER — Other Ambulatory Visit: Payer: Self-pay

## 2017-12-22 DIAGNOSIS — I5020 Unspecified systolic (congestive) heart failure: Secondary | ICD-10-CM | POA: Diagnosis not present

## 2017-12-22 DIAGNOSIS — I214 Non-ST elevation (NSTEMI) myocardial infarction: Secondary | ICD-10-CM | POA: Diagnosis not present

## 2017-12-22 DIAGNOSIS — K5790 Diverticulosis of intestine, part unspecified, without perforation or abscess without bleeding: Secondary | ICD-10-CM | POA: Diagnosis not present

## 2017-12-22 DIAGNOSIS — G8929 Other chronic pain: Secondary | ICD-10-CM | POA: Diagnosis not present

## 2017-12-22 DIAGNOSIS — D649 Anemia, unspecified: Secondary | ICD-10-CM | POA: Diagnosis not present

## 2017-12-22 DIAGNOSIS — B182 Chronic viral hepatitis C: Secondary | ICD-10-CM | POA: Diagnosis not present

## 2017-12-22 DIAGNOSIS — K7460 Unspecified cirrhosis of liver: Secondary | ICD-10-CM | POA: Diagnosis not present

## 2017-12-22 DIAGNOSIS — K4090 Unilateral inguinal hernia, without obstruction or gangrene, not specified as recurrent: Secondary | ICD-10-CM | POA: Diagnosis not present

## 2017-12-22 DIAGNOSIS — I4891 Unspecified atrial fibrillation: Secondary | ICD-10-CM | POA: Diagnosis not present

## 2017-12-22 NOTE — Patient Outreach (Signed)
[facility]) Care Management  12/22/2017  [name] 1931/08/11 [id number]   Transition of care  Referral date: 12/16/17 Referral source: discharged from [place]  On 12/14/17 Insurance: [employer] Medicare Attempt #2  Telephone call to patient regarding transition of care follow up. Unable to reach patients daughter, Brain Hilts.  HIPAA compliant voice message left with call back phone number.   PLAN: RNCM will attempt 3rd  telephone call to patient and/ or daughter wtihin 4 business days.     Quinn Plowman RN,BSN,CCM [facility] Telephonic  [phone]

## 2017-12-23 ENCOUNTER — Emergency Department (HOSPITAL_COMMUNITY): Payer: Medicare HMO

## 2017-12-23 ENCOUNTER — Emergency Department (HOSPITAL_COMMUNITY)
Admission: EM | Admit: 2017-12-23 | Discharge: 2017-12-24 | Disposition: A | Discharge status: Other Acute Inpatient Hospital | Payer: Medicare HMO | Attending: Emergency Medicine | Admitting: Emergency Medicine

## 2017-12-23 ENCOUNTER — Other Ambulatory Visit: Payer: Self-pay

## 2017-12-23 DIAGNOSIS — Y999 Unspecified external cause status: Secondary | ICD-10-CM | POA: Insufficient documentation

## 2017-12-23 DIAGNOSIS — D66 Hereditary factor VIII deficiency: Secondary | ICD-10-CM | POA: Diagnosis not present

## 2017-12-23 DIAGNOSIS — N179 Acute kidney failure, unspecified: Secondary | ICD-10-CM | POA: Diagnosis not present

## 2017-12-23 DIAGNOSIS — X58XXXA Exposure to other specified factors, initial encounter: Secondary | ICD-10-CM | POA: Diagnosis not present

## 2017-12-23 DIAGNOSIS — I4891 Unspecified atrial fibrillation: Secondary | ICD-10-CM | POA: Insufficient documentation

## 2017-12-23 DIAGNOSIS — R000 Tachycardia, unspecified: Secondary | ICD-10-CM | POA: Insufficient documentation

## 2017-12-23 DIAGNOSIS — R140 Abdominal distension (gaseous): Secondary | ICD-10-CM | POA: Diagnosis not present

## 2017-12-23 DIAGNOSIS — G8929 Other chronic pain: Secondary | ICD-10-CM | POA: Diagnosis not present

## 2017-12-23 DIAGNOSIS — K5790 Diverticulosis of intestine, part unspecified, without perforation or abscess without bleeding: Secondary | ICD-10-CM | POA: Diagnosis not present

## 2017-12-23 DIAGNOSIS — I10 Essential (primary) hypertension: Secondary | ICD-10-CM | POA: Insufficient documentation

## 2017-12-23 DIAGNOSIS — K529 Noninfective gastroenteritis and colitis, unspecified: Secondary | ICD-10-CM | POA: Diagnosis not present

## 2017-12-23 DIAGNOSIS — M7981 Nontraumatic hematoma of soft tissue: Secondary | ICD-10-CM | POA: Diagnosis not present

## 2017-12-23 DIAGNOSIS — Y929 Unspecified place or not applicable: Secondary | ICD-10-CM | POA: Diagnosis not present

## 2017-12-23 DIAGNOSIS — Y939 Activity, unspecified: Secondary | ICD-10-CM | POA: Diagnosis not present

## 2017-12-23 DIAGNOSIS — I214 Non-ST elevation (NSTEMI) myocardial infarction: Secondary | ICD-10-CM | POA: Diagnosis not present

## 2017-12-23 DIAGNOSIS — S301XXA Contusion of abdominal wall, initial encounter: Secondary | ICD-10-CM | POA: Insufficient documentation

## 2017-12-23 DIAGNOSIS — R112 Nausea with vomiting, unspecified: Secondary | ICD-10-CM | POA: Diagnosis not present

## 2017-12-23 DIAGNOSIS — R180 Malignant ascites: Secondary | ICD-10-CM

## 2017-12-23 DIAGNOSIS — K7460 Unspecified cirrhosis of liver: Secondary | ICD-10-CM | POA: Diagnosis not present

## 2017-12-23 DIAGNOSIS — Z87891 Personal history of nicotine dependence: Secondary | ICD-10-CM | POA: Diagnosis not present

## 2017-12-23 DIAGNOSIS — J9811 Atelectasis: Secondary | ICD-10-CM | POA: Diagnosis not present

## 2017-12-23 DIAGNOSIS — S3991XA Unspecified injury of abdomen, initial encounter: Secondary | ICD-10-CM | POA: Diagnosis present

## 2017-12-23 DIAGNOSIS — D649 Anemia, unspecified: Secondary | ICD-10-CM | POA: Diagnosis not present

## 2017-12-23 DIAGNOSIS — B182 Chronic viral hepatitis C: Secondary | ICD-10-CM | POA: Diagnosis not present

## 2017-12-23 DIAGNOSIS — I5020 Unspecified systolic (congestive) heart failure: Secondary | ICD-10-CM | POA: Diagnosis not present

## 2017-12-23 DIAGNOSIS — K4090 Unilateral inguinal hernia, without obstruction or gangrene, not specified as recurrent: Secondary | ICD-10-CM | POA: Diagnosis not present

## 2017-12-23 DIAGNOSIS — K2970 Gastritis, unspecified, without bleeding: Secondary | ICD-10-CM | POA: Diagnosis not present

## 2017-12-23 LAB — CBC
HCT: 40.2 % (ref 39.0–52.0)
Hemoglobin: 12.8 g/dL — ABNORMAL LOW (ref 13.0–17.0)
MCH: 29 pg (ref 26.0–34.0)
MCHC: 31.8 g/dL (ref 30.0–36.0)
MCV: 91 fL (ref 78.0–100.0)
Platelets: 31 10*3/uL — ABNORMAL LOW (ref 150–400)
RBC: 4.42 MIL/uL (ref 4.22–5.81)
RDW: 27.8 % — AB (ref 11.5–15.5)
WBC: 14.3 10*3/uL — ABNORMAL HIGH (ref 4.0–10.5)

## 2017-12-23 LAB — CBC WITH DIFFERENTIAL/PLATELET
Basophils Absolute: 0 10*3/uL (ref 0.0–0.1)
Basophils Relative: 0 %
EOS ABS: 0 10*3/uL (ref 0.0–0.7)
Eosinophils Relative: 0 %
HCT: 40.2 % (ref 39.0–52.0)
Hemoglobin: 13 g/dL (ref 13.0–17.0)
LYMPHS ABS: 0.4 10*3/uL — AB (ref 0.7–4.0)
Lymphocytes Relative: 3 %
MCH: 29.1 pg (ref 26.0–34.0)
MCHC: 32.3 g/dL (ref 30.0–36.0)
MCV: 89.9 fL (ref 78.0–100.0)
MONO ABS: 0.6 10*3/uL (ref 0.1–1.0)
Monocytes Relative: 4 %
NEUTROS ABS: 13.9 10*3/uL — AB (ref 1.7–7.7)
Neutrophils Relative %: 93 %
PLATELETS: 59 10*3/uL — AB (ref 150–400)
RBC: 4.47 MIL/uL (ref 4.22–5.81)
RDW: 27.7 % — AB (ref 11.5–15.5)
WBC: 14.9 10*3/uL — AB (ref 4.0–10.5)

## 2017-12-23 LAB — PROTIME-INR
INR: 1.23
PROTHROMBIN TIME: 15.4 s — AB (ref 11.4–15.2)

## 2017-12-23 LAB — I-STAT CHEM 8, ED
BUN: 48 mg/dL — AB (ref 6–20)
CALCIUM ION: 1.07 mmol/L — AB (ref 1.15–1.40)
CREATININE: 1.7 mg/dL — AB (ref 0.61–1.24)
Chloride: 114 mmol/L — ABNORMAL HIGH (ref 101–111)
GLUCOSE: 119 mg/dL — AB (ref 65–99)
HCT: 40 % (ref 39.0–52.0)
Hemoglobin: 13.6 g/dL (ref 13.0–17.0)
Potassium: 3.7 mmol/L (ref 3.5–5.1)
Sodium: 145 mmol/L (ref 135–145)
TCO2: 19 mmol/L — AB (ref 22–32)

## 2017-12-23 LAB — COMPREHENSIVE METABOLIC PANEL
ALBUMIN: 2.4 g/dL — AB (ref 3.5–5.0)
ALK PHOS: 69 U/L (ref 38–126)
ALT: 51 U/L (ref 17–63)
ANION GAP: 13 (ref 5–15)
AST: 69 U/L — AB (ref 15–41)
BUN: 59 mg/dL — AB (ref 6–20)
CALCIUM: 8.2 mg/dL — AB (ref 8.9–10.3)
CO2: 16 mmol/L — ABNORMAL LOW (ref 22–32)
Chloride: 115 mmol/L — ABNORMAL HIGH (ref 101–111)
Creatinine, Ser: 1.93 mg/dL — ABNORMAL HIGH (ref 0.61–1.24)
GFR calc Af Amer: 35 mL/min — ABNORMAL LOW (ref >60)
GFR, EST NON AFRICAN AMERICAN: 30 mL/min — AB (ref >60)
GLUCOSE: 122 mg/dL — AB (ref 65–99)
Potassium: 3.8 mmol/L (ref 3.5–5.1)
Sodium: 144 mmol/L (ref 135–145)
Total Bilirubin: 2.2 mg/dL — ABNORMAL HIGH (ref 0.3–1.2)
Total Protein: 5.8 g/dL — ABNORMAL LOW (ref 6.5–8.1)

## 2017-12-23 LAB — APTT: aPTT: 49 seconds — ABNORMAL HIGH (ref 24–36)

## 2017-12-23 MED ORDER — DILTIAZEM LOAD VIA INFUSION
10.0000 mg | Freq: Once | INTRAVENOUS | Status: AC
  Administered 2017-12-23: 10 mg via INTRAVENOUS
  Filled 2017-12-23: qty 10

## 2017-12-23 MED ORDER — DILTIAZEM HCL-DEXTROSE 100-5 MG/100ML-% IV SOLN (PREMIX)
5.0000 mg/h | INTRAVENOUS | Status: DC
  Administered 2017-12-23: 5 mg/h via INTRAVENOUS
  Filled 2017-12-23: qty 100

## 2017-12-23 MED ORDER — SODIUM CHLORIDE 0.9 % IJ SOLN
INTRAMUSCULAR | Status: AC
  Filled 2017-12-23: qty 50

## 2017-12-23 MED ORDER — IOPAMIDOL (ISOVUE-300) INJECTION 61%
INTRAVENOUS | Status: AC
  Filled 2017-12-23: qty 100

## 2017-12-23 MED ORDER — SODIUM CHLORIDE 0.9 % IV BOLUS
500.0000 mL | Freq: Once | INTRAVENOUS | Status: AC
  Administered 2017-12-23: 500 mL via INTRAVENOUS

## 2017-12-23 MED ORDER — IOPAMIDOL (ISOVUE-300) INJECTION 61%
100.0000 mL | Freq: Once | INTRAVENOUS | Status: AC | PRN
  Administered 2017-12-23: 80 mL via INTRAVENOUS

## 2017-12-23 MED ORDER — ANTIHEM FACTOR RECOMB (RFVIII) 500 UNITS IV KIT
2847.0000 [IU] | PACK | Freq: Once | INTRAVENOUS | Status: AC
  Administered 2017-12-23: 2847 [IU] via INTRAVENOUS
  Filled 2017-12-23: qty 2347

## 2017-12-23 NOTE — ED Notes (Signed)
Pt transported to CT ?

## 2017-12-23 NOTE — ED Provider Notes (Addendum)
[facility] DEPT Provider Note   CSN: [id number] Arrival date & time: 12/23/17  1324     History   Chief Complaint Chief Complaint  Patient presents with  . Bleeding/Bruising    HPI [name] is a 84 y.o. male.  HPI Patient presents with bruising in his right flank.  History of hemophilia A.  Managed at [facility].  No trauma but is moved around in bed at times.  Has bruising in his right lower flank laterally.  Some swelling superior to this over lower ribs and right flank.  This area is not ecchymotic but is edematous.  Swelling is approximately 15 cm x 10 cm.  Also abdominal tenderness.  Has had abdominal tenderness previously without a clear cause found by CT scan and MRI.  Has had nausea and vomiting.  No fevers.  No dysuria. Past Medical History:  Diagnosis Date  . Hemophilia A ([place])   . Hypertension     Patient Active Problem List   Diagnosis Date Noted  . Chronic pain 04/18/2015  . Angina pectoris ([place])   . Hyperlipidemia   . 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 left knee replacement          Home Medications    Prior to Admission medications   Medication Sig Start Date End Date Taking? Authorizing Provider  amiodarone (PACERONE) 200 MG tablet Take 200 mg by mouth daily. 12/14/17  Yes [provider]  atorvastatin (LIPITOR) 40 MG tablet Take 40 mg by mouth every evening. 12/14/17  Yes [provider]  [facility] ASPIRIN ADULT LOW DOSE 81 MG chewable tablet CHEW 1 TABLET (81 MG TOTAL) DAILY. 12/14/17  Yes [provider]  [facility] MELATONIN 3 MG TABS Take 6 mg by mouth at bedtime. 12/14/17  Yes [provider]  [facility] PAIN RELIEF EXTRA STRENGTH 500 MG tablet Take 500 mg by mouth daily as needed. 12/14/17  Yes [provider]  dexamethasone (DECADRON) 0.5 MG tablet TAKE 1MG  (2 TABS) BY MOUTH EVERY MORNING FOR 7 DAYS FOLLOWED BY 1 TAB EVERY MORNING FOR 7  DAYS 12/14/17  Yes [provider]  furosemide (LASIX) 20 MG tablet TAKE 1 TABLET (20 MG TOTAL) BY MOUTH EVERY MONDAY, WEDNESDAY, AND FRIDAY. 12/14/17  Yes [provider]  hydrALAZINE (APRESOLINE) 10 MG tablet TAKE 1 TABLET (10 MG TOTAL) BY MOUTH EVERY EIGHT (8) HOURS. 12/14/17  Yes [provider]  metoprolol succinate (TOPROL-XL) 25 MG 24 hr tablet Take 25 mg by mouth daily. 12/14/17  Yes [provider]  nitroGLYCERIN (NITROSTAT) 0.4 MG SL tablet Place 0.4 mg under the tongue every 5 (five) minutes as needed for chest pain.   Yes [provider]  oxyCODONE (OXY IR/ROXICODONE) 5 MG immediate release tablet Take 5 mg by mouth every 4 (four) hours as needed for pain. 12/14/17  Yes [provider]  pantoprazole (PROTONIX) 40 MG tablet Take 40 mg by mouth daily. 12/14/17  Yes [provider]  sodium bicarbonate 650 MG tablet TAKE 1 TABLET (650 MG TOTAL) BY MOUTH THREE (3) TIMES A DAY. 12/14/17  Yes [provider]  XTAMPZA ER 9 MG C12A Take 9 mg by mouth every 12 (twelve) hours. 12/14/17  Yes [provider]  atenolol (TENORMIN) 50 MG tablet Take 50 mg by mouth 2 (two) times daily.    [provider]  ciprofloxacin (CIPRO) 500 MG tablet Take 1 tablet (500 mg total)  by mouth 2 (two) times daily. Patient not taking: Reported on 12/23/2017 11/11/17   Jeannett Senior, PA-C  metroNIDAZOLE (FLAGYL) 500 MG tablet Take 1 tablet (500 mg total) by mouth 3 (three) times daily. Patient not taking: Reported on 12/23/2017 11/11/17   Jeannett Senior, PA-C  morphine (MSIR) 30 MG tablet Take 1 tablet (30 mg total) by mouth every 4 (four) hours as needed. Patient not taking: Reported on 12/23/2017 10/26/17   Magrinat, Virgie Dad, MD    Family History Family History  Problem Relation Age of Onset  . Hypotension Mother   . Prostate cancer Father     Social History Social History   Tobacco Use  . Smoking status: Former Smoker     Last attempt to quit: 11/19/1956    Years since quitting: 61.1  . Smokeless tobacco: Never Used  Substance Use Topics  . Alcohol use: Never    Frequency: Never  . Drug use: Never     Allergies   Aspirin; Ibuprofen; Prednisone; and Codeine   Review of Systems Review of Systems  Constitutional: Positive for appetite change.  HENT: Negative for congestion.   Respiratory: Negative for shortness of breath.   Cardiovascular: Negative for chest pain.  Gastrointestinal: Positive for abdominal pain, nausea and vomiting.  Genitourinary: Positive for flank pain.  Musculoskeletal: Positive for back pain.  Skin: Negative for pallor.  Hematological: Bruises/bleeds easily.  Psychiatric/Behavioral: Negative for confusion.     Physical Exam Updated Vital Signs BP 96/68   Pulse (!) 116   Temp 98 F (36.7 C) (Axillary)   Resp 14   Ht 5\' 8"  (1.727 m)   Wt 59 kg (130 lb)   SpO2 96%   BMI 19.77 kg/m   Physical Exam  Constitutional: He appears well-developed.  HENT:  Head: Atraumatic.  Eyes: EOM are normal.  Neck: Neck supple.  Cardiovascular:  Tachycardia.  Pulmonary/Chest:  Swollen edematous area approximately 15 cm x 10 cm right lateral lower ribs down to right flank.  Inferior to this there is a ecchymotic area.  No crepitance.  No deformity.  Abdominal:  Lower abdominal tenderness without rebound or guarding.  Musculoskeletal: He exhibits edema.  Neurological: He is alert.  Skin: Skin is warm. Capillary refill takes less than 2 seconds.  Psychiatric: He has a normal mood and affect.     ED Treatments / Results  Labs (all labs ordered are listed, but only abnormal results are displayed) Labs Reviewed  COMPREHENSIVE METABOLIC PANEL - Abnormal; Notable for the following components:      Result Value   Chloride 115 (*)    CO2 16 (*)    Glucose, Bld 122 (*)    BUN 59 (*)    Creatinine, Ser 1.93 (*)    Calcium 8.2 (*)    Total Protein 5.8 (*)    Albumin 2.4 (*)     AST 69 (*)    Total Bilirubin 2.2 (*)    GFR calc non Af Amer 30 (*)    GFR calc Af Amer 35 (*)    All other components within normal limits  CBC WITH DIFFERENTIAL/PLATELET - Abnormal; Notable for the following components:   WBC 14.9 (*)    RDW 27.7 (*)    Platelets 59 (*)    Neutro Abs 13.9 (*)    Lymphs Abs 0.4 (*)    All other components within normal limits  PROTIME-INR - Abnormal; Notable for the following components:   Prothrombin Time 15.4 (*)  All other components within normal limits  APTT - Abnormal; Notable for the following components:   aPTT 49 (*)    All other components within normal limits  CBC - Abnormal; Notable for the following components:   WBC 14.3 (*)    Hemoglobin 12.8 (*)    RDW 27.8 (*)    Platelets 31 (*)    All other components within normal limits  I-STAT CHEM 8, ED - Abnormal; Notable for the following components:   Chloride 114 (*)    BUN 48 (*)    Creatinine, Ser 1.70 (*)    Glucose, Bld 119 (*)    Calcium, Ion 1.07 (*)    TCO2 19 (*)    All other components within normal limits  URINALYSIS, ROUTINE W REFLEX MICROSCOPIC    EKG EKG Interpretation  Date/Time:  Thursday December 23 2017 15:27:32 EDT Ventricular Rate:  119 PR Interval:    QRS Duration: 97 QT Interval:  320 QTC Calculation: 451 R Axis:   -18 Text Interpretation:  Atrial flutter with 2:1 AV block Probable LVH with secondary repol abnrm Inferior infarct, age indeterminate Confirmed by Davonna Belling [phone]) on 12/23/2017 4:14:29 PM   Radiology Ct Chest W Contrast  Result Date: 12/23/2017 CLINICAL DATA:  Right lower back bruising with concern for internal bleeding. Patient with hemophilia. Abdominal distention and tenderness. EXAM: CT CHEST, ABDOMEN, AND PELVIS WITH CONTRAST TECHNIQUE: Multidetector CT imaging of the chest, abdomen and pelvis was performed following the standard protocol during bolus administration of intravenous contrast. CONTRAST:  57mL ISOVUE-300  IOPAMIDOL (ISOVUE-300) INJECTION 61% COMPARISON:  Chest CT 12/16/2003, CXR 12/23/2017. CT abdomen and pelvis 11/11/2017 FINDINGS: CT CHEST FINDINGS Cardiovascular: Normal branch pattern of the great vessels with atherosclerosis at the origins. Mild-to-moderate aortic atherosclerosis is noted without aneurysmal dilatation. No dissection. Pulmonary vasculature is unremarkable without embolus. Heart size is borderline enlarged without pericardial effusion. There is left main and three-vessel coronary arteriosclerosis. Mediastinum/Nodes: No thyromegaly or mass. Patent midline trachea without intraluminal abnormality. Patent mainstem bronchi. No axillary mediastinal, or hilar lymphadenopathy. Esophagus is unremarkable. Lungs/Pleura: Moderate to large right and small to moderate left pleural effusions with adjacent compressive atelectasis. Streaky parenchymal scarring anteriorly within the right upper lobe with upper lobe ground-glass opacities compatible with stigmata of pulmonary edema, alveolitis or pneumonitis among some possible etiologies though not exclusive. No dominant mass is identified. Musculoskeletal: Osteoarthritis of the AC and glenohumeral joints bilaterally. Aggressive osseous lesions. CT ABDOMEN PELVIS FINDINGS Hepatobiliary: Hepatic cirrhosis with surface nodularity and redemonstration of heterogeneously enhancing left hepatic lobe mass estimated at 6.6 x 6.3 cm versus 7 x 6.5 cm previously. No additional lesions or biliary dilatation is identified. The gallbladder is free of stones. Pancreas: Unremarkable Spleen: Normal size spleen without mass. Adrenals/Urinary Tract: Normal bilateral adrenal glands. Mild cortical thinning of both kidneys without nephrolithiasis nor obstructive uropathy. Urinary bladder is circumferentially thickened some which may be due to underdistention or cystitis. This is unchanged. Stomach/Bowel: There has been development of a large volume of ascites within the abdomen and  pelvis causing bowel loops to be centralized in position. Moderate-to-marked transmural thickening of the colon is noted along its entirety concerning for changes of colitis though some of this could also be sympathetic thickening. No small-bowel obstruction. Vascular/Lymphatic: Marked aortoiliac and branch vessel atherosclerosis with ectatic appearance of the distal abdominal aorta. No significant stenosis or occlusion. Reproductive: Normal size prostate and seminal vesicles. Other: Diffuse anasarca. Musculoskeletal: Right rectus hematoma overall measuring 8.2 x 8.8 x 4.2 cm  with vascular blush in the caudal aspect compatible with probable small foci of active hemorrhage. Lumbar spondylosis with ankylosis L2-L3. Fluid from the patient's ascites noted within the right inguinal canal. IMPRESSION: CT chest: 1. Moderate to large right and small to moderate left pleural effusions with compressive atelectasis. 2. Faint ground-glass attenuation in the upper lobes, nonspecific but can be seen in areas of hypoventilatory change, alveolitis or pneumonitis some possibilities. Stigmata of pulmonary edema may also have this appearance. Scarring and/or fibrosis anteriorly in the right upper lobe. 3. Main and three-vessel coronary arteriosclerosis. Aortic atherosclerosis without aneurysm or dissection 4. No acute pulmonary embolus. CT AP: 1. Cirrhotic liver with redemonstration of left hepatic mass, largely unchanged measuring 6.6 x 6.3 cm in estimation. No additional lesions. 2. Development of soft tissue anasarca and moderate to large volume of ascites since prior comparison. Fluid has extended into the right inguinal canal. 3. Right rectus hematoma with faint vascular blush suggesting minimal active hemorrhage along its caudal aspect. This measures 8.2 x 8.8 x 4.2 cm (cc by transverse by AP). 4. Diffuse transmural thickening of the colon concerning for pancolitis. 5. Chronic thickened appearance of the urinary bladder which  may reflect chronic cystitis and/or underdistention. 6. Thoracolumbar spondylosis. Electronically Signed   By: Ashley Royalty M.D.   On: 12/23/2017 19:46   Ct Abdomen Pelvis W Contrast  Result Date: 12/23/2017 CLINICAL DATA:  Right lower back bruising with concern for internal bleeding. Patient with hemophilia. Abdominal distention and tenderness. EXAM: CT CHEST, ABDOMEN, AND PELVIS WITH CONTRAST TECHNIQUE: Multidetector CT imaging of the chest, abdomen and pelvis was performed following the standard protocol during bolus administration of intravenous contrast. CONTRAST:  44mL ISOVUE-300 IOPAMIDOL (ISOVUE-300) INJECTION 61% COMPARISON:  Chest CT 12/16/2003, CXR 12/23/2017. CT abdomen and pelvis 11/11/2017 FINDINGS: CT CHEST FINDINGS Cardiovascular: Normal branch pattern of the great vessels with atherosclerosis at the origins. Mild-to-moderate aortic atherosclerosis is noted without aneurysmal dilatation. No dissection. Pulmonary vasculature is unremarkable without embolus. Heart size is borderline enlarged without pericardial effusion. There is left main and three-vessel coronary arteriosclerosis. Mediastinum/Nodes: No thyromegaly or mass. Patent midline trachea without intraluminal abnormality. Patent mainstem bronchi. No axillary mediastinal, or hilar lymphadenopathy. Esophagus is unremarkable. Lungs/Pleura: Moderate to large right and small to moderate left pleural effusions with adjacent compressive atelectasis. Streaky parenchymal scarring anteriorly within the right upper lobe with upper lobe ground-glass opacities compatible with stigmata of pulmonary edema, alveolitis or pneumonitis among some possible etiologies though not exclusive. No dominant mass is identified. Musculoskeletal: Osteoarthritis of the AC and glenohumeral joints bilaterally. Aggressive osseous lesions. CT ABDOMEN PELVIS FINDINGS Hepatobiliary: Hepatic cirrhosis with surface nodularity and redemonstration of heterogeneously enhancing left  hepatic lobe mass estimated at 6.6 x 6.3 cm versus 7 x 6.5 cm previously. No additional lesions or biliary dilatation is identified. The gallbladder is free of stones. Pancreas: Unremarkable Spleen: Normal size spleen without mass. Adrenals/Urinary Tract: Normal bilateral adrenal glands. Mild cortical thinning of both kidneys without nephrolithiasis nor obstructive uropathy. Urinary bladder is circumferentially thickened some which may be due to underdistention or cystitis. This is unchanged. Stomach/Bowel: There has been development of a large volume of ascites within the abdomen and pelvis causing bowel loops to be centralized in position. Moderate-to-marked transmural thickening of the colon is noted along its entirety concerning for changes of colitis though some of this could also be sympathetic thickening. No small-bowel obstruction. Vascular/Lymphatic: Marked aortoiliac and branch vessel atherosclerosis with ectatic appearance of the distal abdominal aorta. No significant stenosis or  occlusion. Reproductive: Normal size prostate and seminal vesicles. Other: Diffuse anasarca. Musculoskeletal: Right rectus hematoma overall measuring 8.2 x 8.8 x 4.2 cm with vascular blush in the caudal aspect compatible with probable small foci of active hemorrhage. Lumbar spondylosis with ankylosis L2-L3. Fluid from the patient's ascites noted within the right inguinal canal. IMPRESSION: CT chest: 1. Moderate to large right and small to moderate left pleural effusions with compressive atelectasis. 2. Faint ground-glass attenuation in the upper lobes, nonspecific but can be seen in areas of hypoventilatory change, alveolitis or pneumonitis some possibilities. Stigmata of pulmonary edema may also have this appearance. Scarring and/or fibrosis anteriorly in the right upper lobe. 3. Main and three-vessel coronary arteriosclerosis. Aortic atherosclerosis without aneurysm or dissection 4. No acute pulmonary embolus. CT AP: 1.  Cirrhotic liver with redemonstration of left hepatic mass, largely unchanged measuring 6.6 x 6.3 cm in estimation. No additional lesions. 2. Development of soft tissue anasarca and moderate to large volume of ascites since prior comparison. Fluid has extended into the right inguinal canal. 3. Right rectus hematoma with faint vascular blush suggesting minimal active hemorrhage along its caudal aspect. This measures 8.2 x 8.8 x 4.2 cm (cc by transverse by AP). 4. Diffuse transmural thickening of the colon concerning for pancolitis. 5. Chronic thickened appearance of the urinary bladder which may reflect chronic cystitis and/or underdistention. 6. Thoracolumbar spondylosis. Electronically Signed   By: Ashley Royalty M.D.   On: 12/23/2017 19:46   Dg Chest Portable 1 View  Result Date: 12/23/2017 CLINICAL DATA:  Vomiting, flank pain. EXAM: PORTABLE CHEST 1 VIEW COMPARISON:  Radiographs of November 24, 2017. FINDINGS: Stable cardiomegaly. Atherosclerosis of thoracic aorta is noted. No pneumothorax is noted. Right basilar opacity is noted concerning for atelectasis or infiltrate with associated pleural effusion. Left lung is unremarkable. Degenerative changes seen involving both glenohumeral joints. IMPRESSION: Right basilar atelectasis or infiltrate is noted with associated pleural effusion. Aortic Atherosclerosis (ICD10-I70.0). Electronically Signed   By: Marijo Conception, M.D.   On: 12/23/2017 17:02    Procedures Procedures (including critical care time)  Medications Ordered in ED Medications  iopamidol (ISOVUE-300) 61 % injection (has no administration in time range)  sodium chloride 0.9 % injection (has no administration in time range)  diltiazem (CARDIZEM) 1 mg/mL load via infusion 10 mg (10 mg Intravenous Bolus from Bag 12/23/17 2139)    And  diltiazem (CARDIZEM) 100 mg in dextrose 5% 176mL (1 mg/mL) infusion (15 mg/hr Intravenous Rate/Dose Change 12/23/17 2315)  sodium chloride 0.9 % bolus 500 mL (0 mLs  Intravenous Stopped 12/23/17 2030)  iopamidol (ISOVUE-300) 61 % injection 100 mL (80 mLs Intravenous Contrast Given 12/23/17 1903)  sodium chloride 0.9 % bolus 500 mL (0 mLs Intravenous Stopped 12/23/17 0947)  antihemophilic factor VIII (recomb) (KOGENATE) injection 2,847 Units (2,847 Units Intravenous Given 12/23/17 2202)     Initial Impression / Assessment and Plan / ED Course  I have reviewed the triage vital signs and the nursing notes.  Pertinent labs & imaging results that were available during my care of the patient were reviewed by me and considered in my medical decision making (see chart for details).     Patient presented for right flank ecchymosis.  History of hemophilia A.  Also found to be tachycardic with a recurrent A. fib.  History of same.  Hemoglobin reassuring.  Creatinine however has increased to 1.7 or 1.9.  Hemoglobin reassuring at 13.  CT scan done and showed new ascites and rectus hematoma.  Cardizem  started for A. fib with RVR.  Fluid boluses given.  Discussed with Dr. Yves Dill from hematology at [facility].  Will give factor  To bring up to 100%.  Accepted in transfer to [facility].  Discussed with Dr. Pete Glatter in the ER for ER to ER transfer.  CRITICAL CARE Performed by: Davonna Belling Total critical care time: 30 minutes Critical care time was exclusive of separately billable procedures and treating other patients. Critical care was necessary to treat or prevent imminent or life-threatening deterioration. Critical care was time spent personally by me on the following activities: development of treatment plan with patient and/or surrogate as well as nursing, discussions with consultants, evaluation of patient's response to treatment, examination of patient, obtaining history from patient or surrogate, ordering and performing treatments and interventions, ordering and review of laboratory studies, ordering and review of radiographic studies, pulse oximetry and re-evaluation of patient's  condition.  Repeat CBC shows mildly decreased in the hemoglobin.  Also may be oral for a some decrease in the platelets.  Ordered no ground units from [facility] to get patient over there.  Will see if [facility] can help transfer.   Final Clinical Impressions(s) / ED Diagnoses   Final diagnoses:  Hematoma of rectus sheath, initial encounter  Hemophilia A ([place])  AKI (acute kidney injury) ([place])  Atrial fibrillation with rapid ventricular response ([place])  Malignant ascites  Colitis    ED Discharge Orders    None       Davonna Belling, MD 12/23/17 2319    Davonna Belling, MD 12/24/17 0000

## 2017-12-23 NOTE — ED Triage Notes (Signed)
Per EMS, patient comes from home. Home health care nurse noticed a bruise on his right side going towards his lower back and was concerned he may have internal bleeding. Family noticed it 1.5 days ago but it wasn't bruised like today. Distention and abdomen tenderness. Pt also reports he vomited stomach contents this morning. Please call hemophilia center per patients family- pt usually goes to [facility]. Pt has a hx of internal bleeding. Pt is alert and oriented.

## 2017-12-23 NOTE — ED Notes (Addendum)
Pt requesting to be placed back on O2, states he can't breathe. Pt O2 sat reading 97% on room air currently. Placed back on 2L nasal cannula per pt request. Will continue to monitor.

## 2017-12-23 NOTE — ED Notes (Addendum)
Attempted IV x 2. IV team order placed, pt will need [place] IV.

## 2017-12-23 NOTE — ED Notes (Signed)
Bed: [name] Expected date:  Expected time:  Means of arrival:  Comments: EMS-bruise/hemophiliac

## 2017-12-23 NOTE — ED Notes (Signed)
Removed pt O2. Pt able to maintain O2 sat of 96% on room air.

## 2017-12-23 NOTE — ED Notes (Signed)
IV team could not get blood, Phelbotomy to come try

## 2017-12-23 NOTE — ED Notes (Signed)
Report called to [facility]. 

## 2017-12-23 NOTE — ED Notes (Signed)
Pt placed on nasal cannula 2L

## 2017-12-24 DIAGNOSIS — I4891 Unspecified atrial fibrillation: Secondary | ICD-10-CM | POA: Diagnosis not present

## 2017-12-24 DIAGNOSIS — D66 Hereditary factor VIII deficiency: Secondary | ICD-10-CM | POA: Diagnosis not present

## 2017-12-24 DIAGNOSIS — N179 Acute kidney failure, unspecified: Secondary | ICD-10-CM | POA: Diagnosis not present

## 2017-12-24 DIAGNOSIS — D68311 Acquired hemophilia: Secondary | ICD-10-CM | POA: Diagnosis not present

## 2017-12-24 DIAGNOSIS — M7981 Nontraumatic hematoma of soft tissue: Secondary | ICD-10-CM | POA: Diagnosis not present

## 2017-12-24 DIAGNOSIS — S301XXA Contusion of abdominal wall, initial encounter: Secondary | ICD-10-CM | POA: Diagnosis not present

## 2017-12-24 DIAGNOSIS — D376 Neoplasm of uncertain behavior of liver, gallbladder and bile ducts: Secondary | ICD-10-CM | POA: Diagnosis not present

## 2017-12-24 DIAGNOSIS — R918 Other nonspecific abnormal finding of lung field: Secondary | ICD-10-CM | POA: Diagnosis not present

## 2017-12-24 DIAGNOSIS — R601 Generalized edema: Secondary | ICD-10-CM | POA: Diagnosis not present

## 2017-12-24 DIAGNOSIS — D696 Thrombocytopenia, unspecified: Secondary | ICD-10-CM | POA: Diagnosis not present

## 2017-12-24 DIAGNOSIS — Z452 Encounter for adjustment and management of vascular access device: Secondary | ICD-10-CM | POA: Diagnosis not present

## 2017-12-24 DIAGNOSIS — Z87891 Personal history of nicotine dependence: Secondary | ICD-10-CM | POA: Diagnosis not present

## 2017-12-24 DIAGNOSIS — R112 Nausea with vomiting, unspecified: Secondary | ICD-10-CM | POA: Diagnosis not present

## 2017-12-24 DIAGNOSIS — A0472 Enterocolitis due to Clostridium difficile, not specified as recurrent: Secondary | ICD-10-CM | POA: Diagnosis not present

## 2017-12-24 DIAGNOSIS — K5100 Ulcerative (chronic) pancolitis without complications: Secondary | ICD-10-CM | POA: Diagnosis not present

## 2017-12-24 DIAGNOSIS — R9431 Abnormal electrocardiogram [ECG] [EKG]: Secondary | ICD-10-CM | POA: Diagnosis not present

## 2017-12-24 DIAGNOSIS — K529 Noninfective gastroenteritis and colitis, unspecified: Secondary | ICD-10-CM | POA: Diagnosis not present

## 2017-12-24 DIAGNOSIS — L89131 Pressure ulcer of right lower back, stage 1: Secondary | ICD-10-CM | POA: Diagnosis not present

## 2017-12-24 DIAGNOSIS — I5023 Acute on chronic systolic (congestive) heart failure: Secondary | ICD-10-CM | POA: Diagnosis not present

## 2017-12-24 DIAGNOSIS — I4949 Other premature depolarization: Secondary | ICD-10-CM | POA: Diagnosis not present

## 2017-12-24 DIAGNOSIS — R079 Chest pain, unspecified: Secondary | ICD-10-CM | POA: Diagnosis not present

## 2017-12-24 DIAGNOSIS — Z6820 Body mass index (BMI) 20.0-20.9, adult: Secondary | ICD-10-CM | POA: Diagnosis not present

## 2017-12-24 DIAGNOSIS — Y999 Unspecified external cause status: Secondary | ICD-10-CM | POA: Diagnosis not present

## 2017-12-24 DIAGNOSIS — I509 Heart failure, unspecified: Secondary | ICD-10-CM | POA: Diagnosis not present

## 2017-12-24 DIAGNOSIS — R159 Full incontinence of feces: Secondary | ICD-10-CM | POA: Diagnosis not present

## 2017-12-24 DIAGNOSIS — I2510 Atherosclerotic heart disease of native coronary artery without angina pectoris: Secondary | ICD-10-CM | POA: Diagnosis not present

## 2017-12-24 DIAGNOSIS — K7460 Unspecified cirrhosis of liver: Secondary | ICD-10-CM | POA: Diagnosis not present

## 2017-12-24 DIAGNOSIS — I4892 Unspecified atrial flutter: Secondary | ICD-10-CM | POA: Diagnosis not present

## 2017-12-24 DIAGNOSIS — R52 Pain, unspecified: Secondary | ICD-10-CM | POA: Diagnosis not present

## 2017-12-24 DIAGNOSIS — I959 Hypotension, unspecified: Secondary | ICD-10-CM | POA: Diagnosis not present

## 2017-12-24 DIAGNOSIS — I5020 Unspecified systolic (congestive) heart failure: Secondary | ICD-10-CM | POA: Diagnosis not present

## 2017-12-24 DIAGNOSIS — R188 Other ascites: Secondary | ICD-10-CM | POA: Diagnosis not present

## 2017-12-24 DIAGNOSIS — S3991XA Unspecified injury of abdomen, initial encounter: Secondary | ICD-10-CM | POA: Diagnosis present

## 2017-12-24 DIAGNOSIS — A0471 Enterocolitis due to Clostridium difficile, recurrent: Secondary | ICD-10-CM | POA: Diagnosis not present

## 2017-12-24 DIAGNOSIS — R000 Tachycardia, unspecified: Secondary | ICD-10-CM | POA: Diagnosis not present

## 2017-12-24 DIAGNOSIS — X58XXXA Exposure to other specified factors, initial encounter: Secondary | ICD-10-CM | POA: Diagnosis not present

## 2017-12-24 DIAGNOSIS — E43 Unspecified severe protein-calorie malnutrition: Secondary | ICD-10-CM | POA: Diagnosis not present

## 2017-12-24 DIAGNOSIS — J90 Pleural effusion, not elsewhere classified: Secondary | ICD-10-CM | POA: Diagnosis not present

## 2017-12-24 DIAGNOSIS — I459 Conduction disorder, unspecified: Secondary | ICD-10-CM | POA: Diagnosis not present

## 2017-12-24 DIAGNOSIS — Y939 Activity, unspecified: Secondary | ICD-10-CM | POA: Diagnosis not present

## 2017-12-24 DIAGNOSIS — I10 Essential (primary) hypertension: Secondary | ICD-10-CM | POA: Diagnosis not present

## 2017-12-24 DIAGNOSIS — K7689 Other specified diseases of liver: Secondary | ICD-10-CM | POA: Diagnosis not present

## 2017-12-24 DIAGNOSIS — I5022 Chronic systolic (congestive) heart failure: Secondary | ICD-10-CM | POA: Diagnosis not present

## 2017-12-24 DIAGNOSIS — Z66 Do not resuscitate: Secondary | ICD-10-CM | POA: Diagnosis not present

## 2017-12-24 DIAGNOSIS — Y929 Unspecified place or not applicable: Secondary | ICD-10-CM | POA: Diagnosis not present

## 2017-12-24 NOTE — ED Notes (Signed)
[facility] contacted for pt transfer to [facility]

## 2017-12-24 NOTE — ED Notes (Signed)
Report given to [facility]. 

## 2017-12-27 ENCOUNTER — Other Ambulatory Visit: Payer: Self-pay

## 2017-12-27 NOTE — Patient Outreach (Signed)
[facility]) Care Management  12/27/2017  RODDIE RIEGLER 18-Feb-1931 [id number]   Transition of care  Referral date:12/16/17 Referral source:discharged from [place] On 12/14/17 Insurance:[employer] Medicare Attempt #3  Telephone call to patient regarding transition of care follow up.  Requested to speak to patient and / or listed daughter, Brain Hilts. Contact answering phone hung up line.   PLAN; If not return call from patient will proceed with closure.   Quinn Plowman RN,BSN,CCM [facility] Telephonic  [phone]

## 2018-01-04 ENCOUNTER — Other Ambulatory Visit: Payer: Self-pay

## 2018-01-04 NOTE — Patient Outreach (Signed)
[facility]) Care Management  01/04/2018  [name] 1930-11-01 [id number]    Transition of care  Referral date:12/16/17 Referral source:discharged from [place] On 12/14/17 Insurance:[employer] Medicare  'No response from patient after 3 telephone calls and outreach letter attempt.  PLAN: RNCM will close patient due to being unable to reach. RNCM will send patients primary MD notification of closure.  Quinn Plowman RN,BSN,CCM [facility] Telephonic  [phone]

## 2018-01-06 DIAGNOSIS — R601 Generalized edema: Secondary | ICD-10-CM | POA: Diagnosis not present

## 2018-01-06 DIAGNOSIS — E43 Unspecified severe protein-calorie malnutrition: Secondary | ICD-10-CM | POA: Diagnosis not present

## 2018-01-06 DIAGNOSIS — R159 Full incontinence of feces: Secondary | ICD-10-CM | POA: Diagnosis not present

## 2018-01-06 DIAGNOSIS — Z6820 Body mass index (BMI) 20.0-20.9, adult: Secondary | ICD-10-CM | POA: Diagnosis not present

## 2018-01-06 DIAGNOSIS — D376 Neoplasm of uncertain behavior of liver, gallbladder and bile ducts: Secondary | ICD-10-CM | POA: Diagnosis not present

## 2018-01-06 DIAGNOSIS — R52 Pain, unspecified: Secondary | ICD-10-CM | POA: Diagnosis not present

## 2018-01-06 DIAGNOSIS — K7460 Unspecified cirrhosis of liver: Secondary | ICD-10-CM | POA: Diagnosis not present

## 2018-01-06 DIAGNOSIS — D68311 Acquired hemophilia: Secondary | ICD-10-CM | POA: Diagnosis not present

## 2018-01-06 DIAGNOSIS — A0472 Enterocolitis due to Clostridium difficile, not specified as recurrent: Secondary | ICD-10-CM | POA: Diagnosis not present

## 2018-01-19 ENCOUNTER — Emergency Department (HOSPITAL_COMMUNITY)
Admission: EM | Admit: 2018-01-19 | Discharge: 2018-01-20 | Disposition: A | Discharge status: Home / Self Care | Payer: Medicare Other | Attending: Emergency Medicine | Admitting: Emergency Medicine

## 2018-01-19 ENCOUNTER — Encounter (HOSPITAL_COMMUNITY): Payer: Self-pay | Admitting: *Deleted

## 2018-01-19 DIAGNOSIS — Z79899 Other long term (current) drug therapy: Secondary | ICD-10-CM | POA: Insufficient documentation

## 2018-01-19 DIAGNOSIS — K7460 Unspecified cirrhosis of liver: Secondary | ICD-10-CM | POA: Diagnosis not present

## 2018-01-19 DIAGNOSIS — R0902 Hypoxemia: Secondary | ICD-10-CM | POA: Diagnosis not present

## 2018-01-19 DIAGNOSIS — I959 Hypotension, unspecified: Secondary | ICD-10-CM | POA: Insufficient documentation

## 2018-01-19 DIAGNOSIS — E860 Dehydration: Secondary | ICD-10-CM | POA: Diagnosis not present

## 2018-01-19 DIAGNOSIS — Z87891 Personal history of nicotine dependence: Secondary | ICD-10-CM | POA: Diagnosis not present

## 2018-01-19 DIAGNOSIS — I10 Essential (primary) hypertension: Secondary | ICD-10-CM | POA: Insufficient documentation

## 2018-01-19 DIAGNOSIS — R0602 Shortness of breath: Secondary | ICD-10-CM | POA: Diagnosis not present

## 2018-01-19 MED ORDER — MORPHINE SULFATE (PF) 4 MG/ML IV SOLN
4.0000 mg | Freq: Once | INTRAVENOUS | Status: AC
  Administered 2018-01-19: 4 mg via INTRAVENOUS
  Filled 2018-01-19: qty 1

## 2018-01-19 NOTE — ED Triage Notes (Signed)
EMS brings pt in from home where he is living with his family.  Pt is on hospice per EMS.  Pt was transported here with DRN for shortness of breath (O2sat 68% on RA for ems) and lack of urination.  Pt has foley in place.  Significant edema noted, especially marked in scrotum and penis and abdomen and lower extremities.  Pt has some moaning ( ems states that he has 0.5mg  ativan and oxycodone of unknown dose prior to PTA by home health).  Pt has blood blister on right heel and ems states that they were told pt has bed sores. Pt is on NRB 15L.

## 2018-01-20 DIAGNOSIS — Z7401 Bed confinement status: Secondary | ICD-10-CM | POA: Diagnosis not present

## 2018-01-20 DIAGNOSIS — M2550 Pain in unspecified joint: Secondary | ICD-10-CM | POA: Diagnosis not present

## 2018-01-20 MED ORDER — MORPHINE SULFATE 20 MG/5ML PO SOLN: 5.0000 mg | ORAL | 0 refills | Status: AC | PRN

## 2018-01-20 MED ORDER — HEPARIN SOD (PORK) LOCK FLUSH 100 UNIT/ML IV SOLN
500.0000 [IU] | Freq: Once | INTRAVENOUS | Status: AC
  Administered 2018-01-20: 500 [IU]
  Filled 2018-01-20: qty 5

## 2018-01-20 NOTE — ED Notes (Signed)
[facility] contacted for tx home  

## 2018-01-20 NOTE — ED Notes (Signed)
Dr. Venora Maples ( EDP) explained plan of care to pt. and family at bedside .

## 2018-01-20 NOTE — ED Notes (Signed)
Dr. Venora Maples notified on hypotension , advised RN to administer Morphine IV for pt.'s comfort

## 2018-01-20 NOTE — ED Provider Notes (Signed)
[facility] EMERGENCY DEPARTMENT Provider Note   CSN: [id number] Arrival date & time: 01/19/18  2115     History   Chief Complaint Chief Complaint  Patient presents with  . Shortness of Breath   Level 5 caveat: Altered mental status  HPI [name] is a 84 y.o. male.  HPI Patient is an 84 year old male presents the emergency department with increasing shortness of breath and decreased urine output in his Foley catheter.  He has not eaten any significant food or drink fluids in the past 2 days.  Family reports he is becoming weaker.  He is currently involved with hospice after recent diagnosis of end-stage cirrhosis.  Family states that he recently was discharged from [facility] and prefers to be at home with his family and his animals.  He is a DO NOT RESUSCITATE.  Family is focused on comfort measures only     Past Medical History:  Diagnosis Date  . 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 Hypertension     Patient Active Problem List   Diagnosis Date Noted  . Chronic pain 04/18/2015  . Angina pectoris ([place])   . Hyperlipidemia   . 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 left knee replacement          Home Medications    Prior to Admission medications   Medication Sig Start Date End Date Taking? Authorizing Provider  amiodarone (PACERONE) 200 MG tablet Take 200 mg by mouth daily. 12/14/17   [provider]  atenolol (TENORMIN) 50 MG tablet Take 50 mg by mouth 2 (two) times daily.    [provider]  atorvastatin (LIPITOR) 40 MG tablet Take 40 mg by mouth every evening. 12/14/17   [provider]  ciprofloxacin (CIPRO) 500 MG tablet Take 1 tablet (500 mg total) by mouth 2 (two) times daily. Patient not taking: Reported on 12/23/2017 11/11/17   Jeannett Senior, PA-C  [facility] ASPIRIN ADULT LOW DOSE 81 MG chewable tablet CHEW 1 TABLET (81 MG TOTAL) DAILY. 12/14/17   [provider]  [facility] MELATONIN 3 MG TABS Take 6 mg by mouth at bedtime. 12/14/17   [provider]  [facility] PAIN RELIEF EXTRA STRENGTH 500 MG tablet Take 500 mg by mouth daily as needed. 12/14/17   [provider]  dexamethasone (DECADRON) 0.5 MG tablet TAKE 1MG  (2 TABS) BY MOUTH EVERY MORNING FOR 7 DAYS FOLLOWED BY 1 TAB EVERY MORNING FOR 7 DAYS 12/14/17   [provider]  furosemide (LASIX) 20 MG tablet TAKE 1 TABLET (20 MG TOTAL) BY MOUTH EVERY MONDAY, WEDNESDAY, AND FRIDAY. 12/14/17   [provider]  hydrALAZINE (APRESOLINE) 10 MG tablet TAKE 1 TABLET (10 MG TOTAL) BY MOUTH EVERY EIGHT (8) HOURS. 12/14/17   [provider]  metoprolol succinate (TOPROL-XL) 25 MG 24 hr tablet Take 25 mg by mouth daily. 12/14/17   [provider]  metroNIDAZOLE (FLAGYL) 500 MG tablet Take 1 tablet (500 mg total) by mouth 3 (three) times daily. Patient not taking: Reported on 12/23/2017 11/11/17   Jeannett Senior, PA-C  morphine (MSIR) 30 MG tablet Take 1 tablet (30 mg total) by mouth every 4 (four) hours as needed. Patient not taking: Reported on 12/23/2017 10/26/17   Magrinat, Virgie Dad, MD  morphine 20 MG/5ML solution Take 1.3 mLs (5.2 mg total) by mouth every 2 (two) hours as needed for pain.    Jola Schmidt, MD  nitroGLYCERIN (NITROSTAT) 0.4 MG SL tablet Place 0.4 mg under the tongue every 5 (five) minutes as needed for chest pain.    [provider]  oxyCODONE (OXY IR/ROXICODONE) 5 MG immediate release tablet Take 5 mg by mouth every 4 (four) hours as needed for pain. 12/14/17   [provider]  pantoprazole (PROTONIX) 40 MG tablet Take 40 mg by mouth daily. 12/14/17   [provider]  sodium bicarbonate 650 MG tablet TAKE 1 TABLET (650 MG TOTAL) BY MOUTH THREE (3) TIMES A DAY. 12/14/17   [provider]  XTAMPZA ER 9 MG C12A Take 9 mg by mouth every 12 (twelve) hours. 12/14/17   [provider]    Family  History Family History  Problem Relation Age of Onset  . Hypotension Mother   . Prostate cancer Father     Social History Social History   Tobacco Use  . Smoking status: Former Smoker    Last attempt to quit: 11/19/1956    Years since quitting: 61.2  . Smokeless tobacco: Never Used  Substance Use Topics  . Alcohol use: Never    Frequency: Never  . Drug use: Never     Allergies   Aspirin; Ibuprofen; Prednisone; and Codeine   Review of Systems Review of Systems  Unable to perform ROS: Mental status change     Physical Exam Updated Vital Signs BP (!) 90/53   Pulse 74   Temp (!) 95.6 F (35.3 C) (Oral)   Resp 11   SpO2 91%   Physical Exam  Constitutional:  Pale.  Chronically ill-appearing.  HENT:  Head: Normocephalic and atraumatic.  Eyes: EOM are normal.  Neck: Normal range of motion.  Cardiovascular: Normal rate, regular rhythm and intact distal pulses.  Pulmonary/Chest: Effort normal and breath sounds normal. No stridor. No respiratory distress. He has no wheezes.  Abdominal: Soft. He exhibits no distension. There is no tenderness.  Musculoskeletal: Normal range of motion.  Neurological:  Opens eyes to voice.  Does not follow commands.  Withdraws to pain.  Skin: Skin is warm and dry.  Psychiatric:  Unable to test  Nursing note and vitals reviewed.    ED Treatments / Results  Labs (all labs ordered are listed, but only abnormal results are displayed) Labs Reviewed - No data to display  EKG None  Radiology No results found.  Procedures Procedures (including critical care time)  Medications Ordered in ED Medications  heparin lock flush 100 unit/mL (has no administration in time range)  morphine 4 MG/ML injection 4 mg (4 mg Intravenous Given 01/19/18 2212)  morphine 4 MG/ML injection 4 mg (4 mg Intravenous Given 01/19/18 2359)     Initial Impression / Assessment and Plan / ED Course  I have reviewed the triage vital signs and the nursing  notes.  Pertinent labs & imaging results that were available during my care of the patient were reviewed by me and considered in my medical decision making (see chart for details).     Discussion had with patient's family members and wife.  Focus at this time is on comfort measures only.  Patient's pain was treated in the emergency department.  His dressing to his sacral decubitus was changed.  There are no surrounding signs of infection.  He appears to be actively dying.  His pain was treated here in the emergency department with morphine which seemed to significantly improve his symptoms.  He seems much more comfortable.  Patient is always stated that he would  like to be at home and to die at home.  Family is agreeable to taking the patient home.  I spoke with his hospice team who will come and see him in the morning.  They recommended prescribing oral Roxanol for pain control at home.  A prescription has been written.  There is a high likelihood he will die within the next 12 to 24 hours.  Family is aware of this and understands.  Final Clinical Impressions(s) / ED Diagnoses   Final diagnoses:  Hepatic cirrhosis, unspecified hepatic cirrhosis type, unspecified whether ascites present ([place])  Hypotension, unspecified hypotension type  Dehydration    ED Discharge Orders        Ordered    morphine 20 MG/5ML solution  Every 2 hours PRN      0041       Jola Schmidt, MD  [phone]

## 2018-01-29 DEATH — deceased

## 2018-06-30 ENCOUNTER — Ambulatory Visit: Payer: Self-pay | Admitting: Oncology

## 2018-06-30 ENCOUNTER — Other Ambulatory Visit: Payer: Self-pay

## 2019-04-21 IMAGING — CT CT CHEST W/ CM
2 of 6 series · 12 of 36 positions shown, 15 images · IV contrast (ISOVUE)
Comparison: Chest CT 12/16/2003, CXR 12/23/2017. CT abdomen and
pelvis 11/11/2017

CLINICAL DATA: Right lower back bruising with concern for internal
bleeding. Patient with hemophilia. Abdominal distention and
tenderness.

EXAM:
CT CHEST, ABDOMEN, AND PELVIS WITH CONTRAST
TECHNIQUE: Multidetector CT imaging of the chest, abdomen and pelvis was
performed following the standard protocol during bolus
administration of intravenous contrast.
CONTRAST:  80mL [id number] IOPAMIDOL ([id number]) INJECTION 61%

[Series 3: thins · axial · 0.96mm/px · z∈[-518,+7]mm · 9 of 890 slices shown, 12 images]
[im 94/890  mediastinal]
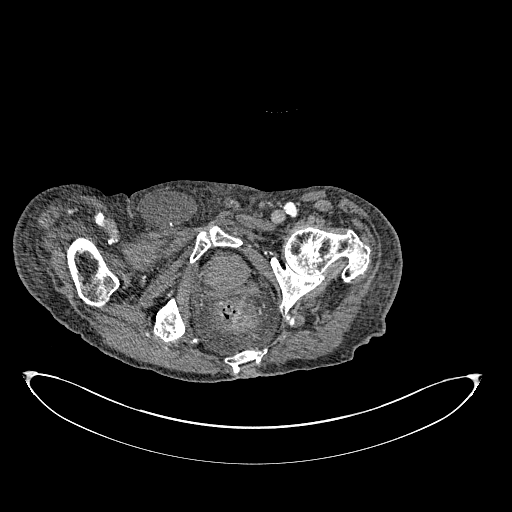
[im 94/890  lung]
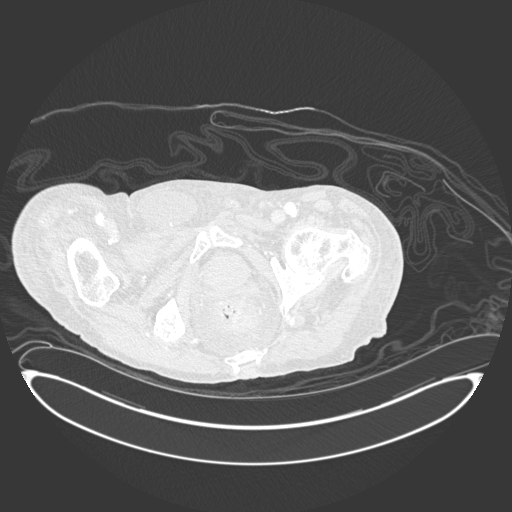
[im 188/890  lung]
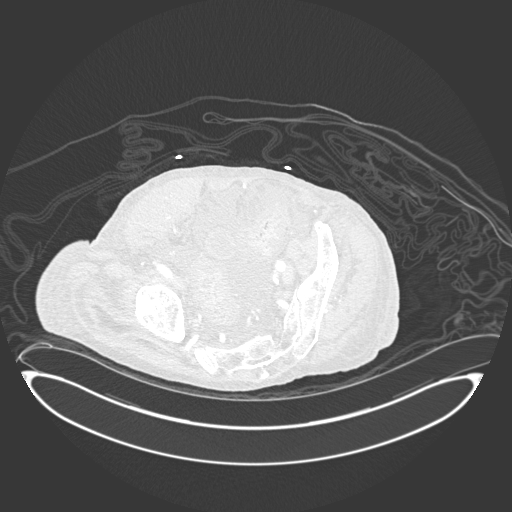
[im 281/890  lung]
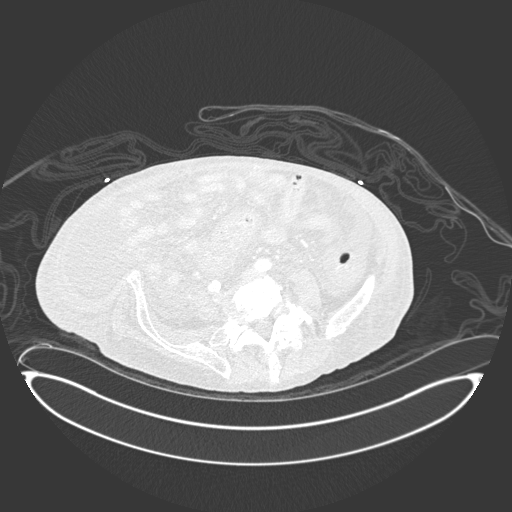
[im 375/890  lung]
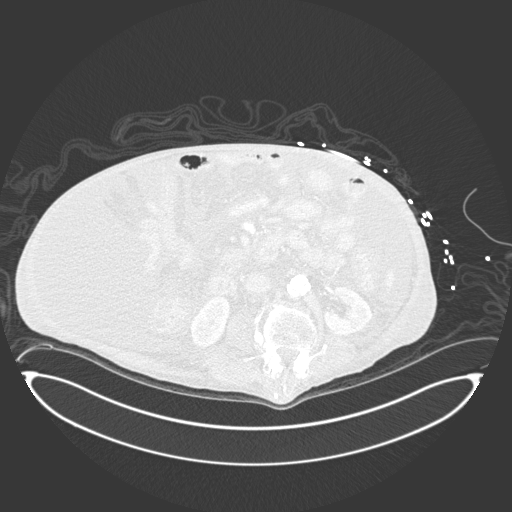
[im 468/890  mediastinal]
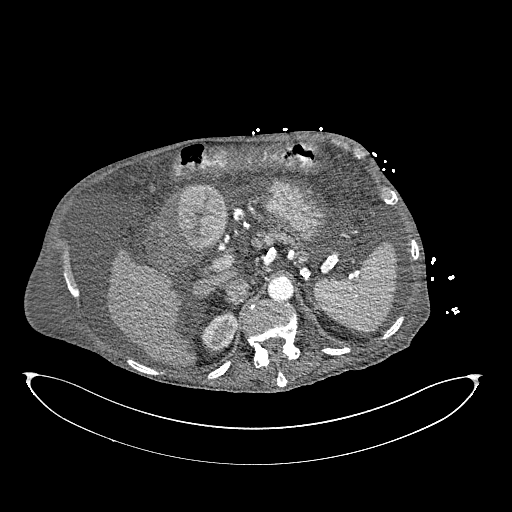
[im 468/890  lung]
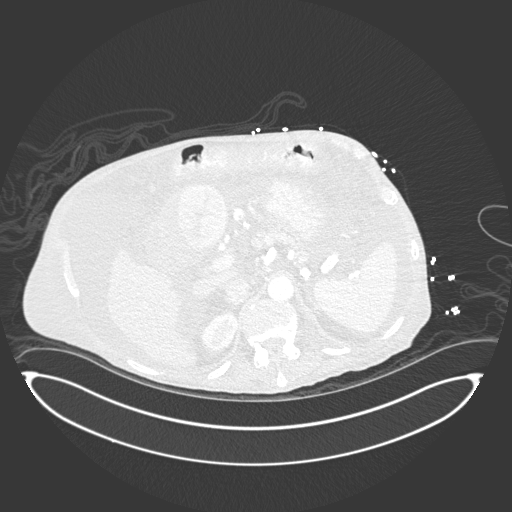
[im 562/890  lung]
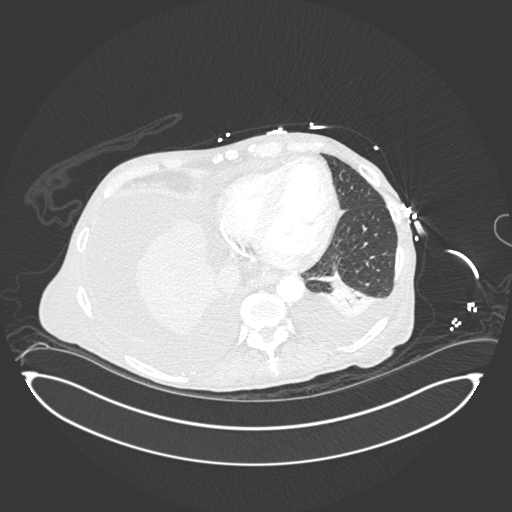
[im 656/890  lung]
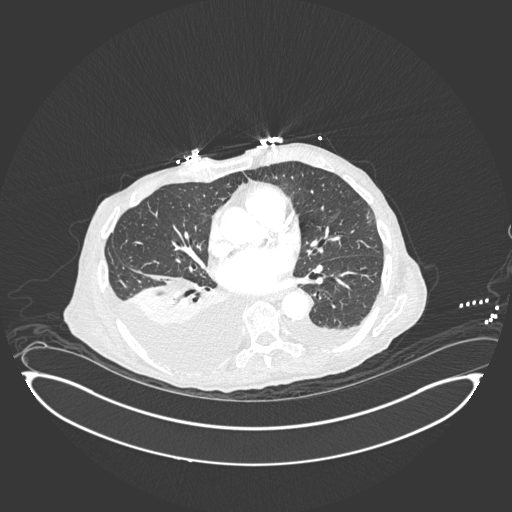
[im 749/890  lung]
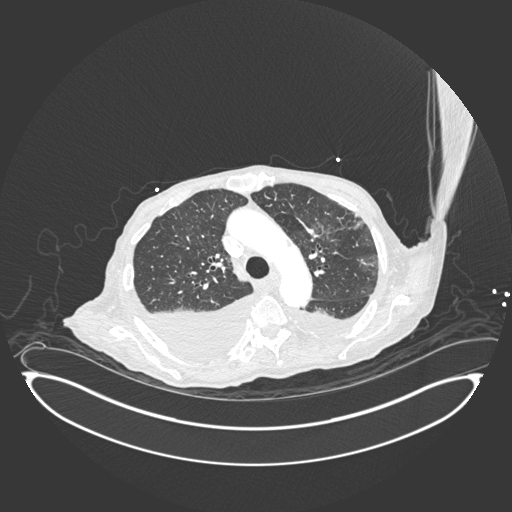
[im 843/890  mediastinal]
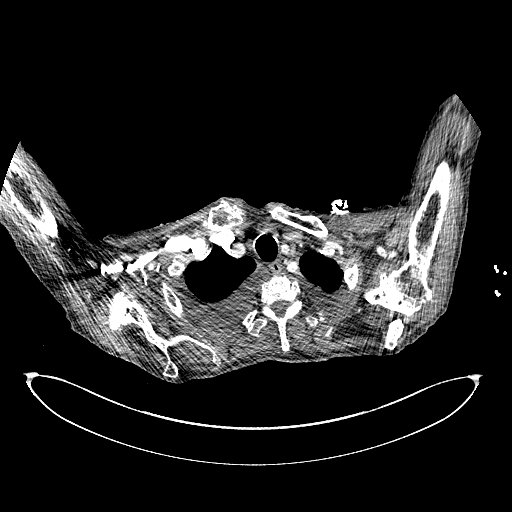
[im 843/890  lung]
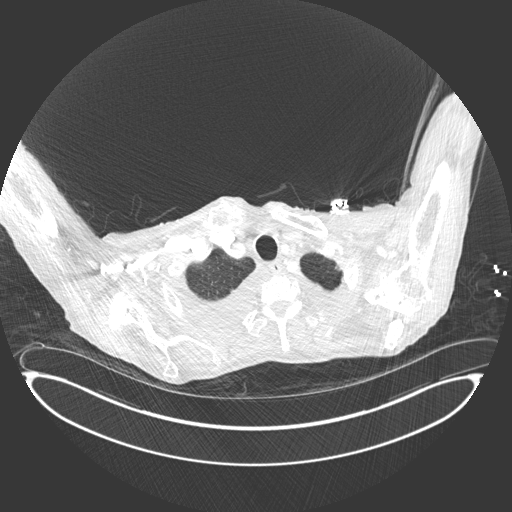

[Series 5: coronals · coronal · 0.82mm/px · 3 of 131 slices shown]
[im 27/131  lung]
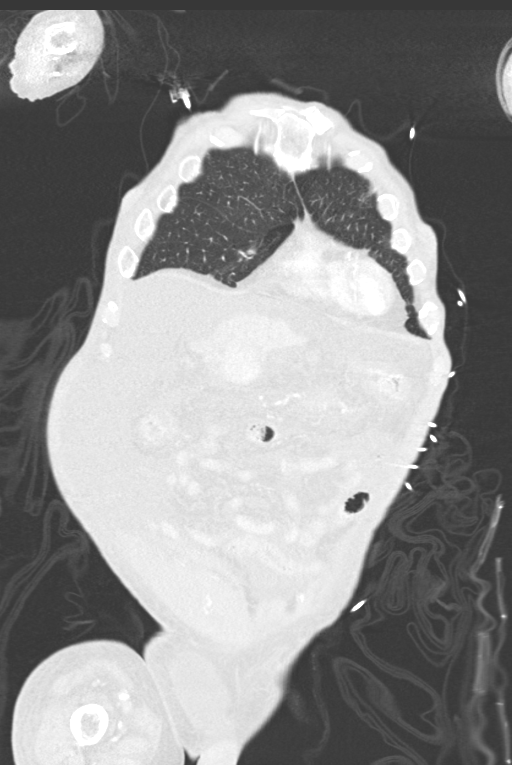
[im 53/131  lung]
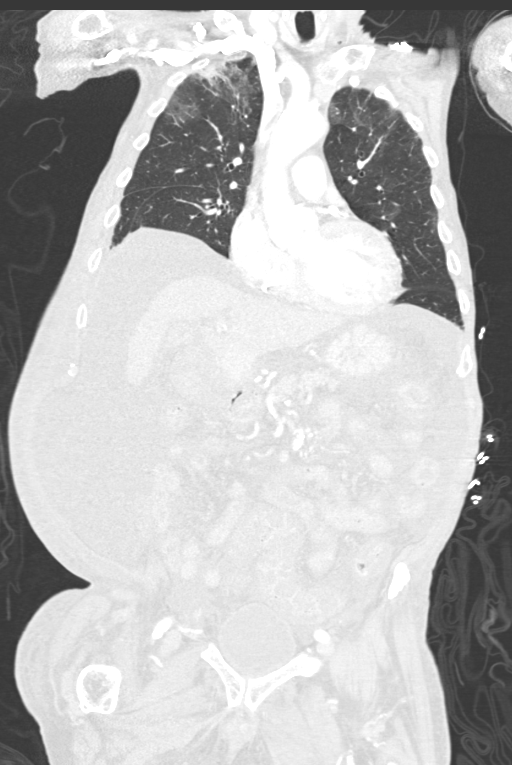
[im 79/131  lung]
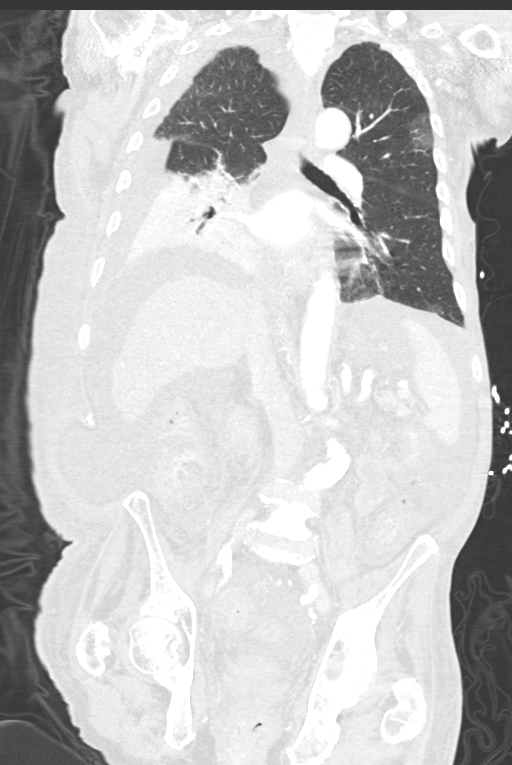

[12 of 36 positions shown; findings below may reference images not displayed]

FINDINGS: CT CHEST FINDINGS

Cardiovascular: Normal branch pattern of the great vessels with
atherosclerosis at the origins. Mild-to-moderate aortic
atherosclerosis is noted without aneurysmal dilatation. No
dissection. Pulmonary vasculature is unremarkable without embolus.
Heart size is borderline enlarged without pericardial effusion.
There is left main and three-vessel coronary arteriosclerosis.

Mediastinum/Nodes: No thyromegaly or mass. Patent midline trachea
without intraluminal abnormality. Patent mainstem bronchi. No
axillary mediastinal, or hilar lymphadenopathy. Esophagus is
unremarkable.

Lungs/Pleura: Moderate to large right and small to moderate left
pleural effusions with adjacent compressive atelectasis. Streaky
parenchymal scarring anteriorly within the right upper lobe with
upper lobe ground-glass opacities compatible with stigmata of
pulmonary edema, alveolitis or pneumonitis among some possible
etiologies though not exclusive. No dominant mass is identified.

Musculoskeletal: Osteoarthritis of the AC and glenohumeral joints
bilaterally. Aggressive osseous lesions.

CT ABDOMEN PELVIS FINDINGS

Hepatobiliary: Hepatic cirrhosis with surface nodularity and
redemonstration of heterogeneously enhancing left hepatic lobe mass
estimated at 6.6 x 6.3 cm versus 7 x 6.5 cm previously. No
additional lesions or biliary dilatation is identified. The
gallbladder is free of stones.

Pancreas: Unremarkable

Spleen: Normal size spleen without mass.

Adrenals/Urinary Tract: Normal bilateral adrenal glands. Mild
cortical thinning of both kidneys without nephrolithiasis nor
obstructive uropathy. Urinary bladder is circumferentially thickened
some which may be due to underdistention or cystitis. This is
unchanged.

Stomach/Bowel: There has been development of a large volume of
ascites within the abdomen and pelvis causing bowel loops to be
centralized in position. Moderate-to-marked transmural thickening of
the colon is noted along its entirety concerning for changes of
colitis though some of this could also be sympathetic thickening. No
small-bowel obstruction.

Vascular/Lymphatic: Marked aortoiliac and branch vessel
atherosclerosis with ectatic appearance of the distal abdominal
aorta. No significant stenosis or occlusion.

Reproductive: Normal size prostate and seminal vesicles.

Other: Diffuse anasarca.

Musculoskeletal: Right rectus hematoma overall measuring 8.2 x 8.8 x
4.2 cm with vascular blush in the caudal aspect compatible with
probable small foci of active hemorrhage. Lumbar spondylosis with
ankylosis L2-L3. Fluid from the patient's ascites noted within the
right inguinal canal.
IMPRESSION: CT chest:

1. Moderate to large right and small to moderate left pleural
effusions with compressive atelectasis.
2. Faint ground-glass attenuation in the upper lobes, nonspecific
but can be seen in areas of hypoventilatory change, alveolitis or
pneumonitis some possibilities. Stigmata of pulmonary edema may also
have this appearance. Scarring and/or fibrosis anteriorly in the
right upper lobe.
3. Main and three-vessel coronary arteriosclerosis. Aortic
atherosclerosis without aneurysm or dissection
4. No acute pulmonary embolus.

CT AP:

1. Cirrhotic liver with redemonstration of left hepatic mass,
largely unchanged measuring 6.6 x 6.3 cm in estimation. No
additional lesions.
2. Development of soft tissue anasarca and moderate to large volume
of ascites since prior comparison. Fluid has extended into the right
inguinal canal.
3. Right rectus hematoma with faint vascular blush suggesting
minimal active hemorrhage along its caudal aspect. This measures
x 8.8 x 4.2 cm (cc by transverse by AP).
4. Diffuse transmural thickening of the colon concerning for
pancolitis.
5. Chronic thickened appearance of the urinary bladder which may
reflect chronic cystitis and/or underdistention.
6. Thoracolumbar spondylosis.
